# Patient Record
Sex: Female | Born: 1935 | Race: White | Hispanic: No | Smoking: Never smoker
Health system: Southern US, Community
[De-identification: ages and names within clinical notes are randomized; demographics above are authoritative.]

## PROBLEM LIST (undated history)

## (undated) DIAGNOSIS — F0390 Unspecified dementia without behavioral disturbance: Secondary | ICD-10-CM

## (undated) DIAGNOSIS — I10 Essential (primary) hypertension: Secondary | ICD-10-CM

## (undated) DIAGNOSIS — Z975 Presence of (intrauterine) contraceptive device: Secondary | ICD-10-CM

## (undated) DIAGNOSIS — E785 Hyperlipidemia, unspecified: Secondary | ICD-10-CM

## (undated) HISTORY — PX: OTHER SURGICAL HISTORY: SHX169

## (undated) HISTORY — DX: Hyperlipidemia, unspecified: E78.5

---

## 1997-07-02 ENCOUNTER — Ambulatory Visit (HOSPITAL_COMMUNITY): Admission: RE | Admit: 1997-07-02 | Discharge: 1997-07-02 | Payer: Self-pay | Admitting: Internal Medicine

## 1999-05-26 ENCOUNTER — Encounter: Admission: RE | Admit: 1999-05-26 | Discharge: 1999-05-26 | Payer: Self-pay | Admitting: Internal Medicine

## 1999-05-26 ENCOUNTER — Encounter: Payer: Self-pay | Admitting: Internal Medicine

## 2000-09-14 ENCOUNTER — Encounter: Admission: RE | Admit: 2000-09-14 | Discharge: 2000-09-14 | Payer: Self-pay | Admitting: Internal Medicine

## 2000-09-14 ENCOUNTER — Encounter: Payer: Self-pay | Admitting: Internal Medicine

## 2002-03-22 ENCOUNTER — Encounter: Payer: Self-pay | Admitting: Emergency Medicine

## 2002-03-22 ENCOUNTER — Emergency Department (HOSPITAL_COMMUNITY): Admission: EM | Admit: 2002-03-22 | Discharge: 2002-03-22 | Payer: Self-pay | Admitting: Emergency Medicine

## 2003-03-26 ENCOUNTER — Ambulatory Visit (HOSPITAL_COMMUNITY): Admission: RE | Admit: 2003-03-26 | Discharge: 2003-03-26 | Payer: Self-pay | Admitting: Obstetrics and Gynecology

## 2003-04-18 ENCOUNTER — Ambulatory Visit (HOSPITAL_COMMUNITY): Admission: RE | Admit: 2003-04-18 | Discharge: 2003-04-18 | Payer: Self-pay | Admitting: Obstetrics and Gynecology

## 2003-04-18 ENCOUNTER — Encounter (INDEPENDENT_AMBULATORY_CARE_PROVIDER_SITE_OTHER): Payer: Self-pay | Admitting: *Deleted

## 2003-05-28 ENCOUNTER — Encounter: Admission: RE | Admit: 2003-05-28 | Discharge: 2003-05-28 | Payer: Self-pay | Admitting: Internal Medicine

## 2003-08-20 ENCOUNTER — Encounter: Admission: RE | Admit: 2003-08-20 | Discharge: 2003-08-20 | Payer: Self-pay | Admitting: Internal Medicine

## 2003-08-27 ENCOUNTER — Encounter: Admission: RE | Admit: 2003-08-27 | Discharge: 2003-08-27 | Payer: Self-pay | Admitting: Internal Medicine

## 2003-09-03 ENCOUNTER — Encounter (HOSPITAL_COMMUNITY): Admission: RE | Admit: 2003-09-03 | Discharge: 2003-09-23 | Payer: Self-pay | Admitting: Internal Medicine

## 2007-07-08 ENCOUNTER — Emergency Department (HOSPITAL_COMMUNITY): Admission: EM | Admit: 2007-07-08 | Discharge: 2007-07-08 | Payer: Self-pay | Admitting: Emergency Medicine

## 2010-06-04 NOTE — Op Note (Signed)
Diane Nolan, Diane Nolan                           ACCOUNT NO.:  [id number]   MEDICAL RECORD NO.:  [id number]                   PATIENT TYPE:  AMB   LOCATION:  DAY                                  FACILITY:  [facility]   PHYSICIAN:  Malachi Pro. Ambrose Mantle, M.D.              DATE OF BIRTH:  [birth year]   DATE OF PROCEDURE:  04/18/2003  DATE OF DISCHARGE:                                 OPERATIVE REPORT   PREOPERATIVE DIAGNOSES:  1. Fourth-degree cystocele.  2. Uterine prolapse.  3. History of postmenopausal bleeding.  4. Cervical stenosis.   POSTOPERATIVE DIAGNOSES:  1. Fourth-degree cystocele.  2. Uterine prolapse.  3. History of postmenopausal bleeding.  4. Cervical stenosis.  5. Endometrial polyp.   OPERATION:  1. Fractional D&C.  2. Hysteroscopy.  3. Removal of endometrial polyp.   OPERATOR:  Malachi Pro. Ambrose Mantle, M.D.   ANESTHESIA:  General.   DESCRIPTION OF PROCEDURE:  The patient was brought to the operating room and  placed under satisfactory general anesthesia and placed in lithotomy  position.  Exam revealed the bladder to be prolapsed through the vaginal  introitus, and cervix presented at the vaginal introitus.  There was minimal  rectocele.  The uterus was so poorly supported that it was difficult to feel  but was thought to be mid plane and normal size.  The adnexa were free of  masses.  The vulva, vagina, and perineum were prepped with Betadine  solution, and the patient was draped so that he drape would collect the  sorbitol.  The cervix was drawn into the operative field from the introitus  and since I could not sound the uterus in the office, I was very careful to  use the smallest dilator and try not to force my way into the endometrial  cavity.  I was able to discern a cavity and dilated the cervix to a #25  Pratt dilator.  I then introduced the hysteroscope and easily was able to  identify an endometrial polyp.  The biopsy forceps with the hysteroscope was  not the one  that I prefer.  It was too flimsy to get a good purchase on the  material, so I abandoned that, removed the hysteroscope and removed the  polyp with the polyp forceps.  I then re-looked with the hysteroscope and  saw that I had removed the polyp forceps.  There was also a submucosal bump  in the endometrial cavity that I did not remove because it was not in the  endometrial lining.  It could have represented a small submucous fibroid.  I  could see the right tubal ostia clearly.  The left tubal ostia was never  clearly seen.  There were no other polyps or intraluminal masses in the  endometrial cavity.  The endocervical canal was clean.  I finished by doing  an endocervical and then endometrial curettage.  There was a bleeding point  from the tenaculum site on the  cervix that I sutured to stop the bleeding, and the procedure was  terminated.  The deficit was 140 mL of sorbitol, but there was significant  sorbitol on the floor secondary to improper fitting of the hysteroscope.  The patient was returned to recovery in satisfactory condition.                                               Malachi Pro. Ambrose Mantle, M.D.    TFH/MEDQ  D:  04/18/2003  T:  04/18/2003  Job:  [id number]

## 2010-10-14 LAB — POCT I-STAT, CHEM 8
BUN: 25 — ABNORMAL HIGH
Calcium, Ion: 1.16
Chloride: 104
Creatinine, Ser: 1.1
Glucose, Bld: 134 — ABNORMAL HIGH
HCT: 42
Hemoglobin: 14.3
Potassium: 4.1
Sodium: 137
TCO2: 27

## 2010-10-14 LAB — CBC
HCT: 39.8
Hemoglobin: 13.2
MCHC: 33.1
MCV: 92.9
Platelets: 211
RBC: 4.29
RDW: 14.9
WBC: 11.2 — ABNORMAL HIGH

## 2010-10-14 LAB — POCT CARDIAC MARKERS
CKMB, poc: <1 — ABNORMAL LOW
Myoglobin, poc: 70.4
Operator id: 196461
Troponin i, poc: <0.05

## 2010-10-14 LAB — DIFFERENTIAL
Basophils Absolute: 0
Basophils Relative: 0
Eosinophils Absolute: 0.3
Eosinophils Relative: 2
Lymphocytes Relative: 20
Lymphs Abs: 2.2
Monocytes Absolute: 0.3
Monocytes Relative: 3
Neutro Abs: 8.4 — ABNORMAL HIGH
Neutrophils Relative %: 75

## 2011-03-22 ENCOUNTER — Other Ambulatory Visit: Payer: Self-pay | Admitting: Internal Medicine

## 2011-03-25 ENCOUNTER — Other Ambulatory Visit: Payer: Self-pay

## 2011-03-25 ENCOUNTER — Ambulatory Visit
Admission: RE | Admit: 2011-03-25 | Discharge: 2011-03-25 | Disposition: A | Discharge status: Home / Self Care | Payer: Medicare Other | Source: Ambulatory Visit | Attending: Internal Medicine | Admitting: Internal Medicine

## 2011-04-29 ENCOUNTER — Emergency Department (HOSPITAL_COMMUNITY): Payer: Medicare Other

## 2011-04-29 ENCOUNTER — Encounter (HOSPITAL_COMMUNITY): Payer: Self-pay | Admitting: General Practice

## 2011-04-29 ENCOUNTER — Emergency Department (HOSPITAL_COMMUNITY)
Admission: EM | Admit: 2011-04-29 | Discharge: 2011-04-29 | Disposition: A | Discharge status: Home / Self Care | Payer: Medicare Other | Attending: Emergency Medicine | Admitting: Emergency Medicine

## 2011-04-29 DIAGNOSIS — Z79899 Other long term (current) drug therapy: Secondary | ICD-10-CM | POA: Insufficient documentation

## 2011-04-29 DIAGNOSIS — R1011 Right upper quadrant pain: Secondary | ICD-10-CM | POA: Insufficient documentation

## 2011-04-29 DIAGNOSIS — K8050 Calculus of bile duct without cholangitis or cholecystitis without obstruction: Secondary | ICD-10-CM

## 2011-04-29 DIAGNOSIS — K8020 Calculus of gallbladder without cholecystitis without obstruction: Secondary | ICD-10-CM | POA: Insufficient documentation

## 2011-04-29 DIAGNOSIS — N390 Urinary tract infection, site not specified: Secondary | ICD-10-CM | POA: Insufficient documentation

## 2011-04-29 DIAGNOSIS — R109 Unspecified abdominal pain: Secondary | ICD-10-CM | POA: Insufficient documentation

## 2011-04-29 DIAGNOSIS — E119 Type 2 diabetes mellitus without complications: Secondary | ICD-10-CM | POA: Insufficient documentation

## 2011-04-29 LAB — URINALYSIS, ROUTINE W REFLEX MICROSCOPIC
Bilirubin Urine: NEGATIVE
Glucose, UA: NEGATIVE mg/dL
Ketones, ur: NEGATIVE mg/dL
Nitrite: POSITIVE — AB
Protein, ur: NEGATIVE mg/dL
Specific Gravity, Urine: 1.01 (ref 1.005–1.030)
Urobilinogen, UA: 0.2 mg/dL (ref 0.0–1.0)
pH: 6.5 (ref 5.0–8.0)

## 2011-04-29 LAB — COMPREHENSIVE METABOLIC PANEL
ALT: 6 U/L (ref 0–35)
AST: 15 U/L (ref 0–37)
Albumin: 3.6 g/dL (ref 3.5–5.2)
Alkaline Phosphatase: 53 U/L (ref 39–117)
BUN: 22 mg/dL (ref 6–23)
CO2: 25 mEq/L (ref 19–32)
Calcium: 9.4 mg/dL (ref 8.4–10.5)
Chloride: 103 mEq/L (ref 96–112)
Creatinine, Ser: 1.09 mg/dL (ref 0.50–1.10)
GFR calc Af Amer: 54 mL/min — ABNORMAL LOW (ref >90)
GFR calc non Af Amer: 46 mL/min — ABNORMAL LOW (ref >90)
Glucose, Bld: 108 mg/dL — ABNORMAL HIGH (ref 70–99)
Potassium: 4.3 mEq/L (ref 3.5–5.1)
Sodium: 138 mEq/L (ref 135–145)
Total Bilirubin: 0.6 mg/dL (ref 0.3–1.2)
Total Protein: 6.9 g/dL (ref 6.0–8.3)

## 2011-04-29 LAB — CBC
HCT: 36 % (ref 36.0–46.0)
Hemoglobin: 11.8 g/dL — ABNORMAL LOW (ref 12.0–15.0)
MCH: 30.1 pg (ref 26.0–34.0)
MCHC: 32.8 g/dL (ref 30.0–36.0)
MCV: 91.8 fL (ref 78.0–100.0)
Platelets: 234 10*3/uL (ref 150–400)
RBC: 3.92 MIL/uL (ref 3.87–5.11)
RDW: 14.2 % (ref 11.5–15.5)
WBC: 6.7 10*3/uL (ref 4.0–10.5)

## 2011-04-29 LAB — URINE MICROSCOPIC-ADD ON

## 2011-04-29 LAB — LIPASE, BLOOD: Lipase: 32 U/L (ref 11–59)

## 2011-04-29 MED ORDER — ONDANSETRON HCL 4 MG/2ML IJ SOLN
4.0000 mg | Freq: Once | INTRAMUSCULAR | Status: DC
  Filled 2011-04-29: qty 2

## 2011-04-29 MED ORDER — SODIUM CHLORIDE 0.9 % IV BOLUS (SEPSIS)
500.0000 mL | Freq: Once | INTRAVENOUS | Status: AC
  Administered 2011-04-29: 500 mL via INTRAVENOUS

## 2011-04-29 MED ORDER — DEXTROSE 5 % IV SOLN: 1.0000 g | Freq: Once | INTRAVENOUS | Status: DC

## 2011-04-29 MED ORDER — MORPHINE SULFATE 4 MG/ML IJ SOLN
4.0000 mg | Freq: Once | INTRAMUSCULAR | Status: DC
  Filled 2011-04-29: qty 1

## 2011-04-29 MED ORDER — CEPHALEXIN 250 MG PO CAPS
250.0000 mg | ORAL_CAPSULE | Freq: Once | ORAL | Status: AC
  Administered 2011-04-29: 250 mg via ORAL
  Filled 2011-04-29: qty 1

## 2011-04-29 MED ORDER — CEPHALEXIN 500 MG PO CAPS: 500.0000 mg | ORAL_CAPSULE | Freq: Four times a day (QID) | ORAL | Status: AC

## 2011-04-29 NOTE — ED Notes (Signed)
Report received from [place], [place].  U/S tech at bedside at this time.

## 2011-04-29 NOTE — ED Provider Notes (Signed)
History     CSN: [id number]  Arrival date & time 04/29/11  1036   First MD Initiated Contact with Patient 04/29/11 1057      Chief Complaint  Patient presents with  . Abdominal Pain    (Consider location/radiation/quality/duration/timing/severity/associated sxs/prior treatment) HPI Pt presents with c/o right upper abdominal pain.  Pain has been intermittent over the past several days.  Pain is dull, intermittent.  No fever/chills, no n/v/d.  Approx one month ago patient had ultrasound and was diagnosed with gallstones- she saw her PMD earlier this week who prescribed pain medication which does help with pain but due to continued pain her PMD advised her to come to the ED.  She states she has called for appointment for follow up with surgery but has not been able to see them yet.  There are no other associated systemic symptoms. There are no alleviating or modifying factors.  Symptoms described as moderate.   Past Medical History  Diagnosis Date  . Diabetes mellitus     History reviewed. No pertinent past surgical history.  No family history on file.  History  Substance Use Topics  . Smoking status: Never Smoker   . Smokeless tobacco: Not on file  . Alcohol Use: No    OB History    Grav Para Term Preterm Abortions TAB SAB Ect Mult Living                  Review of Systems ROS reviewed and all otherwise negative except for mentioned in HPI  Allergies  Review of patient's allergies indicates no known allergies.  Home Medications   Current Outpatient Rx  Name Route Sig Dispense Refill  . ATENOLOL 25 MG PO TABS Oral Take 25 mg by mouth daily.    Marland Kitchen LEVOTHYROXINE SODIUM 50 MCG PO TABS Oral Take 50 mcg by mouth daily.    Marland Kitchen METFORMIN HCL 500 MG PO TABS Oral Take 500 mg by mouth 2 (two) times daily with a meal.    . ADULT MULTIVITAMIN W/MINERALS CH Oral Take 1 tablet by mouth daily.    Marland Kitchen SIMVASTATIN 20 MG PO TABS Oral Take 20 mg by mouth every evening.    . CEPHALEXIN 500  MG PO CAPS Oral Take 1 capsule (500 mg total) by mouth 4 (four) times daily. 28 capsule 0  . HYDROCODONE-ACETAMINOPHEN 5-500 MG PO TABS Oral Take 1 tablet by mouth every 6 (six) hours as needed. For pain.      BP 149/77  Pulse 68  Temp(Src) 98.1 F (36.7 C) (Oral)  Resp 14  SpO2 98% Vitals reviewed Physical Exam Physical Examination: General appearance - alert, well appearing, and in no distress Mental status - alert, oriented to person, place, and time Eyes - pupils equal and reactive, no scleral icterus Mouth - mucous membranes moist, pharynx normal without lesions Chest - clear to auscultation, no wheezes, rales or rhonchi, symmetric air entry Heart - normal rate, regular rhythm, normal S1, S2, no murmurs, rubs, clicks or gallops Abdomen - soft, nontender, nondistended, no masses or organomegaly, nabs Back exam - full range of motion, no tenderness, no CVA tenderness Extremities - peripheral pulses normal, no pedal edema, no clubbing or cyanosis Skin - normal coloration and turgor, no rashes  ED Course  Procedures (including critical care time)  Labs Reviewed  URINALYSIS, ROUTINE W REFLEX MICROSCOPIC - Abnormal; Notable for the following:    APPearance CLOUDY (*)    Hgb urine dipstick TRACE (*)  Nitrite POSITIVE (*)    Leukocytes, UA MODERATE (*)    All other components within normal limits  CBC - Abnormal; Notable for the following:    Hemoglobin 11.8 (*)    All other components within normal limits  COMPREHENSIVE METABOLIC PANEL - Abnormal; Notable for the following:    Glucose, Bld 108 (*)    GFR calc non Af Amer 46 (*)    GFR calc Af Amer 54 (*)    All other components within normal limits  URINE MICROSCOPIC-ADD ON - Abnormal; Notable for the following:    Bacteria, UA MANY (*)    All other components within normal limits  LIPASE, BLOOD   US Abdomen Complete  04/29/2011  *RADIOLOGY REPORT*  Clinical Data:  Abdominal pain, gallstones.  COMPLETE ABDOMINAL  ULTRASOUND  Comparison:  03/25/2011  Findings:  Gallbladder:  Multiple gallstones again noted within the gallbladder.  Largest measures 1.7 cm.  No wall thickening. Negative sonographic Murphy's.  No real change since prior study.  Common bile duct:   Normal caliber, 4 mm.  Liver:  No focal lesion identified.  Within normal limits in parenchymal echogenicity.  IVC:  Appears normal.  Pancreas:  No focal abnormality seen.  Spleen:  Within normal limits in size and echotexture.  Right Kidney:   2.3 cm cyst in the upper pole of the right kidney. No hydronephrosis.  Normal echotexture.  Left Kidney:  Normal in size and parenchymal echogenicity.  No evidence of mass or hydronephrosis.  Abdominal aorta:  No aneurysm identified.  IMPRESSION: Gallstones fill the gallbladder.  No wall thickening or sonographic Murphy's sign to suggest acute cholecystitis.  Original Report Authenticated By: Cyndie Chime, M.D.     1. Gallstones   2. Biliary colic   3. Urinary tract infection       MDM  Pt with hx of gallstones presents with intermittent RUQ abdominal pain.  Workup in ED today reveals no findings of cholecystitis or biliary obstruction.  She does have findings of a UTI- long discussion with patient and her son about importance of arranging for follow up with surgery to discuss possible cholecystectomy.  She was started on keflex for UTI.  Her pain is under good control, and she states the hydrocodone prescribed by her PMD does help with her pain.  Discharged with strict return precautions.  Pt agreeable with plan.   Prior records reviewed and considered during this visit- pt had abdominal ultrasound 03/25/11 showing gallstones but no cholecystitis.         Ethelda Chick, MD 05/02/11 1550

## 2011-04-29 NOTE — Discharge Instructions (Signed)
Return to the ED with any concerns including fever/chills, vomiting and not able to keep down antibiotics or liquids, worsening pain not controlled by pain medications, decreased level of alertness/lethargy, or any other alarming symptoms

## 2011-04-29 NOTE — ED Notes (Signed)
Rt side abd pain states that she called her md and told her she has gallstones and that she needs to come to the er. No n/v just pain, denies any urinary problem.

## 2011-05-06 ENCOUNTER — Encounter (INDEPENDENT_AMBULATORY_CARE_PROVIDER_SITE_OTHER): Payer: Self-pay | Admitting: Surgery

## 2011-05-06 ENCOUNTER — Ambulatory Visit (INDEPENDENT_AMBULATORY_CARE_PROVIDER_SITE_OTHER): Payer: Medicare Other | Admitting: Surgery

## 2011-05-06 VITALS — BP 142/70 | HR 75 | Temp 97.0°F | Resp 16 | Ht 59.0 in | Wt 157.2 lb

## 2011-05-06 DIAGNOSIS — K8020 Calculus of gallbladder without cholecystitis without obstruction: Secondary | ICD-10-CM

## 2011-05-06 NOTE — Patient Instructions (Signed)

## 2011-05-06 NOTE — Progress Notes (Signed)
Patient ID: Diane Nolan, female   DOB: 05/30/1928, 78 y.o.   MRN: [id number]  Chief Complaint  Patient presents with  . New Evaluation    eval GB    HPI Diane Nolan is a 78 y.o. female.          HPI  Patient presents at the request of Dr. Roberts due to right upper quadrant pain. She has had it for one month. It waxes and wanes. She was seen in the emergency room one week ago for a severe exacerbation of right upper quadrant pain. It is sharp in nature. The symptoms wax and wane. Nothing seems to make it worse or better. Ultrasound was obtained which shows gallstones. Liver function studies and CBC were normal. The pain is now gone. Past Medical History  Diagnosis Date  . Diabetes mellitus     Past Surgical History  Procedure Date  . Child birth X1    No family history on file.  Social History History  Substance Use Topics  . Smoking status: Never Smoker   . Smokeless tobacco: Not on file  . Alcohol Use: No    No Known Allergies  Current Outpatient Prescriptions  Medication Sig Dispense Refill  . atenolol (TENORMIN) 25 MG tablet Take 25 mg by mouth daily.      . cephALEXin (KEFLEX) 500 MG capsule Take 1 capsule (500 mg total) by mouth 4 (four) times daily.  28 capsule  0  . HYDROcodone-acetaminophen (VICODIN) 5-500 MG per tablet Take 1 tablet by mouth every 6 (six) hours as needed. For pain.      . levothyroxine (SYNTHROID, LEVOTHROID) 50 MCG tablet Take 50 mcg by mouth daily.      . metFORMIN (GLUCOPHAGE) 500 MG tablet Take 500 mg by mouth 2 (two) times daily with a meal.      . Multiple Vitamin (MULITIVITAMIN WITH MINERALS) TABS Take 1 tablet by mouth daily.      . simvastatin (ZOCOR) 20 MG tablet Take 20 mg by mouth every evening.        Review of Systems Review of Systems  Constitutional: Negative for fever, chills and unexpected weight change.  HENT: Negative for hearing loss, congestion, sore throat, trouble swallowing and voice change.   Eyes: Negative for  visual disturbance.  Respiratory: Negative for cough and wheezing.   Cardiovascular: Negative for chest pain, palpitations and leg swelling.  Gastrointestinal: Negative for nausea, vomiting, abdominal pain, diarrhea, constipation, blood in stool, abdominal distention and anal bleeding.  Genitourinary: Negative for hematuria, vaginal bleeding and difficulty urinating.  Musculoskeletal: Negative for arthralgias.  Skin: Negative for rash and wound.  Neurological: Negative for seizures, syncope and headaches.  Hematological: Negative for adenopathy. Does not bruise/bleed easily.  Psychiatric/Behavioral: Negative for confusion.    Blood pressure 142/70, pulse 75, temperature 97 F (36.1 C), temperature source Temporal, resp. rate 16, height 4' 11" (1.499 m), weight 157 lb 3.2 oz (71.305 kg).  Physical Exam Physical Exam  Constitutional: She is oriented to person, place, and time. She appears well-developed and well-nourished.  HENT:  Head: Normocephalic and atraumatic.  Eyes: EOM are normal. Pupils are equal, round, and reactive to light.  Neck: Normal range of motion. Neck supple.  Cardiovascular: Normal rate and regular rhythm.   Pulmonary/Chest: Effort normal and breath sounds normal.  Abdominal: Soft. Bowel sounds are normal. There is no tenderness.  Musculoskeletal: Normal range of motion.  Neurological: She is alert and oriented to person, place, and time.  Skin: Skin is   warm and dry.  Psychiatric: She has a normal mood and affect. Her behavior is normal. Judgment and thought content normal.    Data Reviewed Gallstones normal lft's   Assessment    Symptomatic cholelithiasis.    Plan LAPAROSCOPIC CHOLECYSTECTOMY    The procedure has been discussed with the patient. Operative and non operative treatments have been discussed. Risks of surgery include bleeding, infection,  Common bile duct injury,  Injury to the stomach,liver, colon,small intestine, abdominal wall,  Diaphragm,   Major blood vessels,  And the need for an open procedure.  Other risks include worsening of medical problems, death,  DVT and pulmonary embolism, and cardiovascular events.   Medical options have also been discussed. The patient has been informed of long term expectations of surgery and non surgical options,  The patient agrees to proceed.         Artemis Koller A. 05/06/2011, 11:19 AM    

## 2011-05-25 ENCOUNTER — Inpatient Hospital Stay (HOSPITAL_COMMUNITY): Admission: RE | Admit: 2011-05-25 | Discharge: 2011-05-25 | Payer: Medicare Other | Source: Ambulatory Visit

## 2011-05-27 ENCOUNTER — Encounter (HOSPITAL_BASED_OUTPATIENT_CLINIC_OR_DEPARTMENT_OTHER): Payer: Self-pay | Admitting: *Deleted

## 2011-05-27 NOTE — Progress Notes (Signed)
Pt sounds good on phone-[place]? Son to come dos-she drives-will come in for labs,cxr,ekg She has very little health-surgical hx. and -has a pessary in Did review with Sandy,Jenny,and dr kasik-expressed concerns to Dr Mignon Pine about age and health of pt for lap choli here at [facility].

## 2011-05-27 NOTE — Progress Notes (Signed)
Dr Mignon Pine called back-he feels pt healthy enough for dsc and to go home same day surgery Dr Gypsy Balsam ok with that and sandy's decision to do here

## 2011-05-31 ENCOUNTER — Ambulatory Visit
Admission: RE | Admit: 2011-05-31 | Discharge: 2011-05-31 | Disposition: A | Discharge status: Home / Self Care | Payer: Medicare Other | Source: Ambulatory Visit | Attending: Surgery | Admitting: Surgery

## 2011-05-31 ENCOUNTER — Encounter (HOSPITAL_BASED_OUTPATIENT_CLINIC_OR_DEPARTMENT_OTHER)
Admission: RE | Admit: 2011-05-31 | Discharge: 2011-05-31 | Disposition: A | Discharge status: Home / Self Care | Payer: Medicare Other | Source: Ambulatory Visit | Attending: Surgery | Admitting: Surgery

## 2011-05-31 ENCOUNTER — Other Ambulatory Visit: Payer: Self-pay

## 2011-05-31 LAB — CBC
HCT: 38.7 % (ref 36.0–46.0)
Hemoglobin: 13 g/dL (ref 12.0–15.0)
MCH: 29.9 pg (ref 26.0–34.0)
MCHC: 33.6 g/dL (ref 30.0–36.0)
MCV: 89 fL (ref 78.0–100.0)
Platelets: 263 10*3/uL (ref 150–400)
RBC: 4.35 MIL/uL (ref 3.87–5.11)
RDW: 14.2 % (ref 11.5–15.5)
WBC: 8 10*3/uL (ref 4.0–10.5)

## 2011-05-31 LAB — COMPREHENSIVE METABOLIC PANEL
ALT: 8 U/L (ref 0–35)
AST: 14 U/L (ref 0–37)
Albumin: 3.7 g/dL (ref 3.5–5.2)
Alkaline Phosphatase: 62 U/L (ref 39–117)
BUN: 28 mg/dL — ABNORMAL HIGH (ref 6–23)
CO2: 23 mEq/L (ref 19–32)
Calcium: 9.7 mg/dL (ref 8.4–10.5)
Chloride: 102 mEq/L (ref 96–112)
Creatinine, Ser: 1.04 mg/dL (ref 0.50–1.10)
GFR calc Af Amer: 57 mL/min — ABNORMAL LOW (ref >90)
GFR calc non Af Amer: 49 mL/min — ABNORMAL LOW (ref >90)
Glucose, Bld: 106 mg/dL — ABNORMAL HIGH (ref 70–99)
Potassium: 5.1 mEq/L (ref 3.5–5.1)
Sodium: 137 mEq/L (ref 135–145)
Total Bilirubin: 0.6 mg/dL (ref 0.3–1.2)
Total Protein: 7.2 g/dL (ref 6.0–8.3)

## 2011-05-31 LAB — DIFFERENTIAL
Basophils Absolute: 0 10*3/uL (ref 0.0–0.1)
Basophils Relative: 0 % (ref 0–1)
Eosinophils Absolute: 0.4 10*3/uL (ref 0.0–0.7)
Eosinophils Relative: 5 % (ref 0–5)
Lymphocytes Relative: 42 % (ref 12–46)
Lymphs Abs: 3.4 10*3/uL (ref 0.7–4.0)
Monocytes Absolute: 0.5 10*3/uL (ref 0.1–1.0)
Monocytes Relative: 6 % (ref 3–12)
Neutro Abs: 3.8 10*3/uL (ref 1.7–7.7)
Neutrophils Relative %: 47 % (ref 43–77)

## 2011-06-01 ENCOUNTER — Encounter (HOSPITAL_BASED_OUTPATIENT_CLINIC_OR_DEPARTMENT_OTHER)
Admission: RE | Disposition: A | Discharge status: Home / Self Care | Payer: Self-pay | Source: Ambulatory Visit | Attending: Surgery

## 2011-06-01 ENCOUNTER — Ambulatory Visit (HOSPITAL_COMMUNITY): Payer: Medicare Other

## 2011-06-01 ENCOUNTER — Encounter (HOSPITAL_BASED_OUTPATIENT_CLINIC_OR_DEPARTMENT_OTHER): Payer: Self-pay | Admitting: Certified Registered Nurse Anesthetist

## 2011-06-01 ENCOUNTER — Encounter (HOSPITAL_COMMUNITY): Admission: RE | Payer: Self-pay | Source: Ambulatory Visit

## 2011-06-01 ENCOUNTER — Ambulatory Visit (HOSPITAL_BASED_OUTPATIENT_CLINIC_OR_DEPARTMENT_OTHER): Payer: Medicare Other | Admitting: Certified Registered Nurse Anesthetist

## 2011-06-01 ENCOUNTER — Encounter (HOSPITAL_BASED_OUTPATIENT_CLINIC_OR_DEPARTMENT_OTHER): Payer: Self-pay | Admitting: *Deleted

## 2011-06-01 ENCOUNTER — Ambulatory Visit (HOSPITAL_COMMUNITY): Admission: RE | Admit: 2011-06-01 | Payer: Medicare Other | Source: Ambulatory Visit | Admitting: Surgery

## 2011-06-01 ENCOUNTER — Ambulatory Visit (HOSPITAL_BASED_OUTPATIENT_CLINIC_OR_DEPARTMENT_OTHER)
Admission: RE | Admit: 2011-06-01 | Discharge: 2011-06-01 | Disposition: A | Discharge status: Home / Self Care | Payer: Medicare Other | Source: Ambulatory Visit | Attending: Surgery | Admitting: Surgery

## 2011-06-01 DIAGNOSIS — I10 Essential (primary) hypertension: Secondary | ICD-10-CM | POA: Insufficient documentation

## 2011-06-01 DIAGNOSIS — K8010 Calculus of gallbladder with chronic cholecystitis without obstruction: Secondary | ICD-10-CM

## 2011-06-01 DIAGNOSIS — Z01812 Encounter for preprocedural laboratory examination: Secondary | ICD-10-CM | POA: Insufficient documentation

## 2011-06-01 DIAGNOSIS — Z01810 Encounter for preprocedural cardiovascular examination: Secondary | ICD-10-CM | POA: Insufficient documentation

## 2011-06-01 DIAGNOSIS — K810 Acute cholecystitis: Secondary | ICD-10-CM | POA: Insufficient documentation

## 2011-06-01 DIAGNOSIS — Z9889 Other specified postprocedural states: Secondary | ICD-10-CM

## 2011-06-01 DIAGNOSIS — E119 Type 2 diabetes mellitus without complications: Secondary | ICD-10-CM | POA: Insufficient documentation

## 2011-06-01 HISTORY — PX: CHOLECYSTECTOMY: SHX55

## 2011-06-01 HISTORY — DX: Presence of (intrauterine) contraceptive device: Z97.5

## 2011-06-01 HISTORY — DX: Essential (primary) hypertension: I10

## 2011-06-01 LAB — GLUCOSE, CAPILLARY
Glucose-Capillary: 101 mg/dL — ABNORMAL HIGH (ref 70–99)
Glucose-Capillary: 182 mg/dL — ABNORMAL HIGH (ref 70–99)

## 2011-06-01 SURGERY — LAPAROSCOPIC CHOLECYSTECTOMY WITH INTRAOPERATIVE CHOLANGIOGRAM
Anesthesia: General | Site: Abdomen | Wound class: Clean

## 2011-06-01 SURGERY — LAPAROSCOPIC CHOLECYSTECTOMY WITH INTRAOPERATIVE CHOLANGIOGRAM
Anesthesia: General

## 2011-06-01 MED ORDER — CEFAZOLIN SODIUM-DEXTROSE 2-3 GM-% IV SOLR: 2.0000 g | INTRAVENOUS | Status: DC

## 2011-06-01 MED ORDER — ROCURONIUM BROMIDE 100 MG/10ML IV SOLN
INTRAVENOUS | Status: DC | PRN
  Administered 2011-06-01: 30 mg via INTRAVENOUS
  Administered 2011-06-01: 5 mg via INTRAVENOUS

## 2011-06-01 MED ORDER — HYDROMORPHONE HCL PF 1 MG/ML IJ SOLN
0.2500 mg | INTRAMUSCULAR | Status: DC | PRN
  Administered 2011-06-01 (×4): 0.5 mg via INTRAVENOUS

## 2011-06-01 MED ORDER — EPHEDRINE SULFATE 50 MG/ML IJ SOLN
INTRAMUSCULAR | Status: DC | PRN
  Administered 2011-06-01: 10 mg via INTRAVENOUS

## 2011-06-01 MED ORDER — LACTATED RINGERS IV SOLN
INTRAVENOUS | Status: DC | PRN
  Administered 2011-06-01 (×2): via INTRAVENOUS

## 2011-06-01 MED ORDER — FENTANYL CITRATE 0.05 MG/ML IJ SOLN
INTRAMUSCULAR | Status: DC | PRN
  Administered 2011-06-01: 50 ug via INTRAVENOUS

## 2011-06-01 MED ORDER — SODIUM CHLORIDE 0.9 % IR SOLN
Status: DC | PRN
  Administered 2011-06-01: 1

## 2011-06-01 MED ORDER — SODIUM CHLORIDE 0.9 % IV SOLN
INTRAVENOUS | Status: DC | PRN
  Administered 2011-06-01: 12:00:00

## 2011-06-01 MED ORDER — ACETAMINOPHEN 10 MG/ML IV SOLN
INTRAVENOUS | Status: DC | PRN
  Administered 2011-06-01: 1000 mg via INTRAVENOUS

## 2011-06-01 MED ORDER — BUPIVACAINE-EPINEPHRINE 0.25% -1:200000 IJ SOLN
INTRAMUSCULAR | Status: DC | PRN
  Administered 2011-06-01: 10 mL

## 2011-06-01 MED ORDER — LACTATED RINGERS IV SOLN
INTRAVENOUS | Status: DC
  Administered 2011-06-01: 15:00:00 via INTRAVENOUS

## 2011-06-01 MED ORDER — PROPOFOL 10 MG/ML IV EMUL
INTRAVENOUS | Status: DC | PRN
  Administered 2011-06-01: 150 mg via INTRAVENOUS

## 2011-06-01 MED ORDER — CHLORHEXIDINE GLUCONATE 4 % EX LIQD: 1.0000 "application " | Freq: Once | CUTANEOUS | Status: DC

## 2011-06-01 MED ORDER — ONDANSETRON HCL 4 MG/2ML IJ SOLN
INTRAMUSCULAR | Status: DC | PRN
  Administered 2011-06-01: 4 mg via INTRAVENOUS

## 2011-06-01 MED ORDER — CEFAZOLIN SODIUM 1-5 GM-% IV SOLN
INTRAVENOUS | Status: DC | PRN
  Administered 2011-06-01: 2 g via INTRAVENOUS

## 2011-06-01 MED ORDER — ONDANSETRON HCL 4 MG/2ML IJ SOLN
4.0000 mg | Freq: Once | INTRAMUSCULAR | Status: AC | PRN
  Administered 2011-06-01: 4 mg via INTRAVENOUS

## 2011-06-01 MED ORDER — DEXAMETHASONE SODIUM PHOSPHATE 4 MG/ML IJ SOLN
INTRAMUSCULAR | Status: DC | PRN
  Administered 2011-06-01: 10 mg via INTRAVENOUS

## 2011-06-01 MED ORDER — LIDOCAINE HCL (CARDIAC) 20 MG/ML IV SOLN
INTRAVENOUS | Status: DC | PRN
  Administered 2011-06-01 (×2): 50 mg via INTRAVENOUS

## 2011-06-01 MED ORDER — METOCLOPRAMIDE HCL 5 MG/ML IJ SOLN
INTRAMUSCULAR | Status: DC | PRN
  Administered 2011-06-01: 10 mg via INTRAVENOUS

## 2011-06-01 MED ORDER — HYDROCODONE-ACETAMINOPHEN 5-325 MG PO TABS: 1.0000 | ORAL_TABLET | Freq: Four times a day (QID) | ORAL | Status: AC | PRN

## 2011-06-01 MED ORDER — GLYCOPYRROLATE 0.2 MG/ML IJ SOLN
INTRAMUSCULAR | Status: DC | PRN
  Administered 2011-06-01: .5 mg via INTRAVENOUS

## 2011-06-01 MED ORDER — NEOSTIGMINE METHYLSULFATE 1 MG/ML IJ SOLN
INTRAMUSCULAR | Status: DC | PRN
  Administered 2011-06-01: 3 mg via INTRAVENOUS

## 2011-06-01 SURGICAL SUPPLY — 39 items
APPLIER CLIP 5 13 M/L LIGAMAX5 (MISCELLANEOUS)
APPLIER CLIP ROT 10 11.4 M/L (STAPLE) ×2
BLADE SURG ROTATE 9660 (MISCELLANEOUS) IMPLANT
CANISTER LINER 1300 C W/ELBOW (MISCELLANEOUS) ×2 IMPLANT
CANISTER SUCTION 2500CC (MISCELLANEOUS) ×2 IMPLANT
CHLORAPREP W/TINT 26ML (MISCELLANEOUS) ×2 IMPLANT
CLIP APPLIE 5 13 M/L LIGAMAX5 (MISCELLANEOUS) IMPLANT
CLIP APPLIE ROT 10 11.4 M/L (STAPLE) ×1 IMPLANT
CLOTH BEACON ORANGE TIMEOUT ST (SAFETY) ×2 IMPLANT
COVER MAYO STAND STRL (DRAPES) IMPLANT
DECANTER SPIKE VIAL GLASS SM (MISCELLANEOUS) ×2 IMPLANT
DERMABOND ADVANCED (GAUZE/BANDAGES/DRESSINGS) ×1
DERMABOND ADVANCED .7 DNX12 (GAUZE/BANDAGES/DRESSINGS) ×1 IMPLANT
DRAPE C-ARM 42X72 X-RAY (DRAPES) ×2 IMPLANT
DRAPE UTILITY 15X26 W/TAPE STR (DRAPE) ×2 IMPLANT
ELECT REM PT RETURN 9FT ADLT (ELECTROSURGICAL) ×2
ELECTRODE REM PT RTRN 9FT ADLT (ELECTROSURGICAL) ×1 IMPLANT
GLOVE BIO SURGEON STRL SZ 6.5 (GLOVE) ×4 IMPLANT
GLOVE BIO SURGEON STRL SZ8 (GLOVE) ×2 IMPLANT
GLOVE BIOGEL PI IND STRL 8 (GLOVE) ×1 IMPLANT
GLOVE BIOGEL PI INDICATOR 8 (GLOVE) ×1
GLOVE INDICATOR 7.0 STRL GRN (GLOVE) ×4 IMPLANT
GOWN PREVENTION PLUS XLARGE (GOWN DISPOSABLE) ×6 IMPLANT
NS IRRIG 1000ML POUR BTL (IV SOLUTION) ×2 IMPLANT
PACK BASIN DAY SURGERY FS (CUSTOM PROCEDURE TRAY) ×2 IMPLANT
POUCH SPECIMEN RETRIEVAL 10MM (ENDOMECHANICALS) ×2 IMPLANT
SCISSORS LAP 5X35 DISP (ENDOMECHANICALS) ×2 IMPLANT
SET CHOLANGIOGRAPH 5 50 .035 (SET/KITS/TRAYS/PACK) ×2 IMPLANT
SET IRRIG TUBING LAPAROSCOPIC (IRRIGATION / IRRIGATOR) ×2 IMPLANT
SLEEVE ENDOPATH XCEL 5M (ENDOMECHANICALS) ×4 IMPLANT
SLEEVE SCD COMPRESS KNEE MED (MISCELLANEOUS) ×2 IMPLANT
SUT MNCRL AB 4-0 PS2 18 (SUTURE) ×2 IMPLANT
SUT VICRYL 0 UR6 27IN ABS (SUTURE) ×4 IMPLANT
TOWEL OR 17X24 6PK STRL BLUE (TOWEL DISPOSABLE) ×2 IMPLANT
TOWEL OR NON WOVEN STRL DISP B (DISPOSABLE) ×2 IMPLANT
TRAY LAPAROSCOPIC (CUSTOM PROCEDURE TRAY) ×2 IMPLANT
TROCAR XCEL BLUNT TIP 100MML (ENDOMECHANICALS) ×2 IMPLANT
TROCAR XCEL NON-BLD 11X100MML (ENDOMECHANICALS) ×2 IMPLANT
TROCAR XCEL NON-BLD 5MMX100MML (ENDOMECHANICALS) ×2 IMPLANT

## 2011-06-01 NOTE — Anesthesia Procedure Notes (Signed)
Procedure Name: Intubation Date/Time: 06/01/2011 11:50 AM Performed by: Aarron Wierzbicki D Pre-anesthesia Checklist: Patient identified, Emergency Drugs available, Suction available and Patient being monitored Patient Re-evaluated:Patient Re-evaluated prior to inductionOxygen Delivery Method: Circle System Utilized Preoxygenation: Pre-oxygenation with 100% oxygen Intubation Type: IV induction Ventilation: Mask ventilation without difficulty Grade View: Grade I Tube type: Oral Number of attempts: 1 Airway Equipment and Method: stylet and oral airway Placement Confirmation: ETT inserted through vocal cords under direct vision,  positive ETCO2 and breath sounds checked- equal and bilateral Secured at: 21 cm Tube secured with: Tape Dental Injury: Teeth and Oropharynx as per pre-operative assessment

## 2011-06-01 NOTE — Discharge Instructions (Signed)
[facility] ______[facility] °LAPAROSCOPIC SURGERY: POST OP INSTRUCTIONS °Always review your discharge instruction sheet given to you by the facility where your surgery was performed. °IF YOU HAVE DISABILITY OR FAMILY LEAVE FORMS, YOU MUST BRING THEM TO THE OFFICE FOR PROCESSING.   °DO NOT GIVE THEM TO YOUR DOCTOR. ° °1. A prescription for pain medication may be given to you upon discharge.  Take your pain medication as prescribed, if needed.  If narcotic pain medicine is not needed, then you may take acetaminophen (Tylenol) or ibuprofen (Advil) as needed. °2. Take your usually prescribed medications unless otherwise directed. °3. If you need a refill on your pain medication, please contact your pharmacy.  They will contact our office to request authorization. Prescriptions will not be filled after 5pm or on week-ends. °4. You should follow a light diet the first few days after arrival home, such as soup and crackers, etc.  Be sure to include lots of fluids daily. °5. Most patients will experience some swelling and bruising in the area of the incisions.  Ice packs will help.  Swelling and bruising can take several days to resolve.  °6. It is common to experience some constipation if taking pain medication after surgery.  Increasing fluid intake and taking a stool softener (such as Colace) will usually help or prevent this problem from occurring.  A mild laxative (Milk of Magnesia or Miralax) should be taken according to package instructions if there are no bowel movements after 48 hours. °7. Unless discharge instructions indicate otherwise, you may remove your bandages 24-48 hours after surgery, and you may shower at that time.  You may have steri-strips (small skin tapes) in place directly over the incision.  These strips should be left on the skin for 7-10 days.  If your surgeon used skin glue on the incision, you may shower in 24 hours.  The glue will flake off over the next 2-3 weeks.  Any sutures or  staples will be removed at the office during your follow-up visit. °8. ACTIVITIES:  You may resume regular (light) daily activities beginning the next day--such as daily self-care, walking, climbing stairs--gradually increasing activities as tolerated.  You may have sexual intercourse when it is comfortable.  Refrain from any heavy lifting or straining until approved by your doctor. °a. You may drive when you are no longer taking prescription pain medication, you can comfortably wear a seatbelt, and you can safely maneuver your car and apply brakes. °b. RETURN TO WORK:  __________________________________________________________ °9. You should see your doctor in the office for a follow-up appointment approximately 2-3 weeks after your surgery.  Make sure that you call for this appointment within a day or two after you arrive home to insure a convenient appointment time. °10. OTHER INSTRUCTIONS: __________________________________________________________________________________________________________________________ __________________________________________________________________________________________________________________________ °WHEN TO CALL YOUR DOCTOR: °1. Fever over 101.0 °2. Inability to urinate °3. Continued bleeding from incision. °4. Increased pain, redness, or drainage from the incision. °5. Increasing abdominal pain ° °The clinic staff is available to answer your questions during regular business hours.  Please don’t hesitate to call and ask to speak to one of the nurses for clinical concerns.  If you have a medical emergency, go to the nearest emergency room or call 911.  A surgeon from [facility] is always on call at the hospital. °[address], [address], [place], [place]  [address] ? [address] [address], [place],    [address] °[phone] ? [phone] ? FAX [phone] °Web site:   [email or web address] ° ° °Post Anesthesia Home Care Instructions ° °Activity: °Get  plenty of rest for the remainder of the day. A responsible adult should stay with you for 24 hours following the procedure.  °For the next 24 hours, DO NOT: °-Drive a car °-Operate machinery °-Drink alcoholic beverages °-Take any medication unless instructed by your physician °-Make any legal decisions or sign important papers. ° °Meals: °Start with liquid foods such as gelatin or soup. Progress to regular foods as tolerated. Avoid greasy, spicy, heavy foods. If nausea and/or vomiting occur, drink only clear liquids until the nausea and/or vomiting subsides. Call your physician if vomiting continues. ° °Special Instructions/Symptoms: °Your throat may feel dry or sore from the anesthesia or the breathing tube placed in your throat during surgery. If this causes discomfort, gargle with warm salt water. The discomfort should disappear within 24 hours. ° °

## 2011-06-01 NOTE — Anesthesia Postprocedure Evaluation (Signed)
Anesthesia Post Note  Patient: Diane Nolan  Procedure(s) Performed: Procedure(s) (LRB): LAPAROSCOPIC CHOLECYSTECTOMY WITH INTRAOPERATIVE CHOLANGIOGRAM (N/A)  Anesthesia type: General  Patient location: PACU  Post pain: Pain level controlled  Post assessment: Patient's Cardiovascular Status Stable  Last Vitals:  Filed Vitals:   06/01/11 1518  BP:   Pulse: 61  Temp:   Resp: 14    Post vital signs: Reviewed and stable  Level of consciousness: alert  Complications: No apparent anesthesia complications

## 2011-06-01 NOTE — H&P (View-Only) (Signed)
Patient ID: Diane Nolan, female   DOB: 05/17/1928, 78 y.o.   MRN: [id number]  Chief Complaint  Patient presents with  . New Evaluation    eval GB    HPI Diane Nolan is a 78 y.o. female.          HPI  Patient presents at the request of Dr. Su Hilt due to right upper quadrant pain. She has had it for one month. It waxes and wanes. She was seen in the emergency room one week ago for a severe exacerbation of right upper quadrant pain. It is sharp in nature. The symptoms wax and wane. Nothing seems to make it worse or better. Ultrasound was obtained which shows gallstones. Liver function studies and CBC were normal. The pain is now gone. Past Medical History  Diagnosis Date  . Diabetes mellitus     Past Surgical History  Procedure Date  . Child birth X1    No family history on file.  Social History History  Substance Use Topics  . Smoking status: Never Smoker   . Smokeless tobacco: Not on file  . Alcohol Use: No    No Known Allergies  Current Outpatient Prescriptions  Medication Sig Dispense Refill  . atenolol (TENORMIN) 25 MG tablet Take 25 mg by mouth daily.      . cephALEXin (KEFLEX) 500 MG capsule Take 1 capsule (500 mg total) by mouth 4 (four) times daily.  28 capsule  0  . HYDROcodone-acetaminophen (VICODIN) 5-500 MG per tablet Take 1 tablet by mouth every 6 (six) hours as needed. For pain.      Marland Kitchen levothyroxine (SYNTHROID, LEVOTHROID) 50 MCG tablet Take 50 mcg by mouth daily.      . metFORMIN (GLUCOPHAGE) 500 MG tablet Take 500 mg by mouth 2 (two) times daily with a meal.      . Multiple Vitamin (MULITIVITAMIN WITH MINERALS) TABS Take 1 tablet by mouth daily.      . simvastatin (ZOCOR) 20 MG tablet Take 20 mg by mouth every evening.        Review of Systems Review of Systems  Constitutional: Negative for fever, chills and unexpected weight change.  HENT: Negative for hearing loss, congestion, sore throat, trouble swallowing and voice change.   Eyes: Negative for  visual disturbance.  Respiratory: Negative for cough and wheezing.   Cardiovascular: Negative for chest pain, palpitations and leg swelling.  Gastrointestinal: Negative for nausea, vomiting, abdominal pain, diarrhea, constipation, blood in stool, abdominal distention and anal bleeding.  Genitourinary: Negative for hematuria, vaginal bleeding and difficulty urinating.  Musculoskeletal: Negative for arthralgias.  Skin: Negative for rash and wound.  Neurological: Negative for seizures, syncope and headaches.  Hematological: Negative for adenopathy. Does not bruise/bleed easily.  Psychiatric/Behavioral: Negative for confusion.    Blood pressure 142/70, pulse 75, temperature 97 F (36.1 C), temperature source Temporal, resp. rate 16, height 4\' 11"  (1.499 m), weight 157 lb 3.2 oz (71.305 kg).  Physical Exam Physical Exam  Constitutional: She is oriented to person, place, and time. She appears well-developed and well-nourished.  HENT:  Head: Normocephalic and atraumatic.  Eyes: EOM are normal. Pupils are equal, round, and reactive to light.  Neck: Normal range of motion. Neck supple.  Cardiovascular: Normal rate and regular rhythm.   Pulmonary/Chest: Effort normal and breath sounds normal.  Abdominal: Soft. Bowel sounds are normal. There is no tenderness.  Musculoskeletal: Normal range of motion.  Neurological: She is alert and oriented to person, place, and time.  Skin: Skin is  warm and dry.  Psychiatric: She has a normal mood and affect. Her behavior is normal. Judgment and thought content normal.    Data Reviewed Gallstones normal lft's   Assessment    Symptomatic cholelithiasis.    Plan LAPAROSCOPIC CHOLECYSTECTOMY    The procedure has been discussed with the patient. Operative and non operative treatments have been discussed. Risks of surgery include bleeding, infection,  Common bile duct injury,  Injury to the stomach,liver, colon,small intestine, abdominal wall,  Diaphragm,   Major blood vessels,  And the need for an open procedure.  Other risks include worsening of medical problems, death,  DVT and pulmonary embolism, and cardiovascular events.   Medical options have also been discussed. The patient has been informed of long term expectations of surgery and non surgical options,  The patient agrees to proceed.         Calli Bashor A. 05/06/2011, 11:19 AM

## 2011-06-01 NOTE — Interval H&P Note (Signed)
History and Physical Interval Note:  06/01/2011 1:08 PM  Diane Nolan  has presented today for surgery, with the diagnosis of Gallstones  The various methods of treatment have been discussed with the patient and family. After consideration of risks, benefits and other options for treatment, the patient has consented to  Procedure(s) (LRB): LAPAROSCOPIC CHOLECYSTECTOMY WITH INTRAOPERATIVE CHOLANGIOGRAM (N/A) as a surgical intervention .  The patients' history has been reviewed, patient examined, no change in status, stable for surgery.  I have reviewed the patients' chart and labs.  Questions were answered to the patient's satisfaction.     Ranata Laughery A.

## 2011-06-01 NOTE — Transfer of Care (Signed)
Immediate Anesthesia Transfer of Care Note  Patient: Diane Nolan  Procedure(s) Performed: Procedure(s) (LRB): LAPAROSCOPIC CHOLECYSTECTOMY WITH INTRAOPERATIVE CHOLANGIOGRAM (N/A)  Patient Location: PACU  Anesthesia Type: General  Level of Consciousness: awake, alert , oriented and patient cooperative  Airway & Oxygen Therapy: Patient Spontanous Breathing and Patient connected to face mask oxygen  Post-op Assessment: Report given to PACU RN and Post -op Vital signs reviewed and stable  Post vital signs: Reviewed and stable  Complications: No apparent anesthesia complications

## 2011-06-01 NOTE — Anesthesia Preprocedure Evaluation (Signed)
Anesthesia Evaluation  Patient identified by MRN, date of birth, ID band Patient awake    Reviewed: Allergy & Precautions, H&P , NPO status , Patient's Chart, lab work & pertinent test results, reviewed documented beta blocker date and time   Airway Mallampati: II TM Distance: >3 FB Neck ROM: full    Dental   Pulmonary neg pulmonary ROS,          Cardiovascular hypertension, On Medications     Neuro/Psych negative neurological ROS  negative psych ROS   GI/Hepatic negative GI ROS, Neg liver ROS,   Endo/Other  Diabetes mellitus-  Renal/GU negative Renal ROS  negative genitourinary   Musculoskeletal   Abdominal   Peds  Hematology negative hematology ROS (+)   Anesthesia Other Findings See surgeon's H&P   Reproductive/Obstetrics negative OB ROS                           Anesthesia Physical Anesthesia Plan  ASA: II  Anesthesia Plan: General   Post-op Pain Management:    Induction: Intravenous  Airway Management Planned: Oral ETT  Additional Equipment:   Intra-op Plan:   Post-operative Plan: Extubation in OR  Informed Consent: I have reviewed the patients History and Physical, chart, labs and discussed the procedure including the risks, benefits and alternatives for the proposed anesthesia with the patient or authorized representative who has indicated his/her understanding and acceptance.   Dental Advisory Given  Plan Discussed with: CRNA and Surgeon  Anesthesia Plan Comments:         Anesthesia Quick Evaluation

## 2011-06-01 NOTE — Op Note (Signed)
Laparoscopic Cholecystectomy with IOC Procedure Note  Indications: This patient presents with symptomatic gallbladder disease and will undergo laparoscopic cholecystectomy.  Pre-operative Diagnosis: Calculus of gallbladder with acute cholecystitis, without mention of obstruction  Post-operative Diagnosis: Same  Surgeon: Lars Jeziorski A.   Assistants: or staff  Anesthesia: General endotracheal anesthesia and Local anesthesia 0.25.% bupivacaine, with epinephrine  ASA Class: 3  Procedure Details  The patient was seen again in the Holding Room. The risks, benefits, complications, treatment options, and expected outcomes were discussed with the patient. The possibilities of reaction to medication, pulmonary aspiration, perforation of viscus, bleeding, recurrent infection, finding a normal gallbladder, the need for additional procedures, failure to diagnose a condition, the possible need to convert to an open procedure, and creating a complication requiring transfusion or operation were discussed with the patient. The patient and/or family concurred with the proposed plan, giving informed consent. The site of surgery properly noted/marked. The patient was taken to Operating Room, identified as Diane Nolan and the procedure verified as Laparoscopic Cholecystectomy with Intraoperative Cholangiograms. A Time Out was held and the above information confirmed.  Prior to the induction of general anesthesia, antibiotic prophylaxis was administered. General endotracheal anesthesia was then administered and tolerated well. After the induction, the abdomen was prepped in the usual sterile fashion. The patient was positioned in the supine position with the left arm comfortably tucked, along with some reverse Trendelenburg.  Local anesthetic agent was injected into the skin near the umbilicus and an incision made. The midline fascia was incised and the Hasson technique was used to introduce a 12 mm port under  direct vision. It was secured with a figure of eight Vicryl suture placed in the usual fashion. Pneumoperitoneum was then created with CO2 and tolerated well without any adverse changes in the patient's vital signs. Additional trocars were introduced under direct vision. All skin incisions were infiltrated with a local anesthetic agent before making the incision and placing the trocars.   The gallbladder was identified, the fundus grasped and retracted cephalad. Adhesions were lysed bluntly and with the electrocautery where indicated, taking care not to injure any adjacent organs or viscus. The infundibulum was grasped and retracted laterally, exposing the peritoneum overlying the triangle of Calot. This was then divided and exposed in a blunt fashion. The cystic duct was clearly identified and bluntly dissected circumferentially. The junctions of the gallbladder, cystic duct and common bile duct were clearly identified prior to the division of any linear structure.   An incision was made in the cystic duct and the cholangiogram catheter introduced. The catheter was secured using an endoclip. The study showed no stones and good visualization of the distal and proximal biliary tree. The catheter was then removed.   The cystic duct was then  ligated with surgical clips  on the patient side and  clipped on the gallbladder side and divided. The cystic artery was identified, dissected free, ligated with clips and divided as well.  Posterior branch of cystic artery was clipped and divided.  The gallbladder was dissected from the liver bed in retrograde fashion with the electrocautery. The gallbladder was removed. The liver bed was irrigated and inspected. Hemostasis was achieved with the electrocautery. Copious irrigation was utilized and was repeatedly aspirated until clear all particulate matter. The gallbladder was removed at the umbilicus with endocatch bag.   Pneumoperitoneum was completely reduced after  viewing removal of the trocars under direct vision. The wound was thoroughly irrigated and the fascia was then closed with a  figure of eight suture; the skin was then closed with 4 o monocryl and a sterile dermabond was applied.  Instrument, sponge, and needle counts were correct at closure and at the conclusion of the case.   Findings: Cholecystitis without Cholelithiasis  Estimated Blood Loss: less than 50 mL         Drains: none         Total IV Fluids: 800 mL         Specimens: Gallbladder           Complications: None; patient tolerated the procedure well.         Disposition: PACU - hemodynamically stable.         Condition: stable

## 2011-06-03 ENCOUNTER — Encounter (HOSPITAL_BASED_OUTPATIENT_CLINIC_OR_DEPARTMENT_OTHER): Payer: Self-pay | Admitting: Surgery

## 2011-06-14 ENCOUNTER — Encounter (INDEPENDENT_AMBULATORY_CARE_PROVIDER_SITE_OTHER): Payer: Medicare Other | Admitting: Surgery

## 2011-06-22 ENCOUNTER — Encounter (INDEPENDENT_AMBULATORY_CARE_PROVIDER_SITE_OTHER): Payer: Self-pay | Admitting: Surgery

## 2011-06-22 ENCOUNTER — Ambulatory Visit (INDEPENDENT_AMBULATORY_CARE_PROVIDER_SITE_OTHER): Payer: Medicare Other | Admitting: Surgery

## 2011-06-22 VITALS — BP 126/70 | HR 80 | Temp 97.7°F | Resp 16 | Ht 60.5 in | Wt 152.0 lb

## 2011-06-22 DIAGNOSIS — Z9889 Other specified postprocedural states: Secondary | ICD-10-CM

## 2011-06-22 NOTE — Patient Instructions (Signed)
Resume full activity  

## 2011-06-22 NOTE — Progress Notes (Signed)
Patient returns after laparoscopic cholecystectomy and cholangiogram. She is doing well. Exam: Wound clean dry intact abdomen soft nontender  Impression: Status post laparoscopic cholecystectomy cholangiogram for symptomatic cholelithiasis  Plan: Resume full activity. Followup as needed.

## 2012-08-09 ENCOUNTER — Encounter: Payer: Self-pay | Admitting: Cardiovascular Disease

## 2012-08-10 ENCOUNTER — Encounter: Payer: Self-pay | Admitting: Cardiovascular Disease

## 2014-04-22 DIAGNOSIS — E1165 Type 2 diabetes mellitus with hyperglycemia: Secondary | ICD-10-CM | POA: Diagnosis not present

## 2016-01-04 ENCOUNTER — Observation Stay (HOSPITAL_COMMUNITY)
Admission: EM | Admit: 2016-01-04 | Discharge: 2016-01-05 | Disposition: A | Discharge status: Home / Self Care | Payer: Medicare Other | Attending: Internal Medicine | Admitting: Internal Medicine

## 2016-01-04 ENCOUNTER — Encounter (HOSPITAL_COMMUNITY): Payer: Self-pay | Admitting: Emergency Medicine

## 2016-01-04 ENCOUNTER — Emergency Department (HOSPITAL_COMMUNITY): Payer: Medicare Other

## 2016-01-04 ENCOUNTER — Observation Stay (HOSPITAL_BASED_OUTPATIENT_CLINIC_OR_DEPARTMENT_OTHER): Payer: Medicare Other

## 2016-01-04 DIAGNOSIS — Z79899 Other long term (current) drug therapy: Secondary | ICD-10-CM | POA: Diagnosis not present

## 2016-01-04 DIAGNOSIS — E119 Type 2 diabetes mellitus without complications: Secondary | ICD-10-CM | POA: Insufficient documentation

## 2016-01-04 DIAGNOSIS — Z7984 Long term (current) use of oral hypoglycemic drugs: Secondary | ICD-10-CM | POA: Diagnosis not present

## 2016-01-04 DIAGNOSIS — R079 Chest pain, unspecified: Secondary | ICD-10-CM

## 2016-01-04 DIAGNOSIS — I10 Essential (primary) hypertension: Secondary | ICD-10-CM | POA: Diagnosis not present

## 2016-01-04 LAB — COMPREHENSIVE METABOLIC PANEL
ALT: 6 U/L — ABNORMAL LOW (ref 14–54)
AST: 14 U/L — ABNORMAL LOW (ref 15–41)
Albumin: 2.5 g/dL — ABNORMAL LOW (ref 3.5–5.0)
Alkaline Phosphatase: 49 U/L (ref 38–126)
Anion gap: 6 (ref 5–15)
BUN: 15 mg/dL (ref 6–20)
CO2: 19 mmol/L — ABNORMAL LOW (ref 22–32)
Calcium: 6.8 mg/dL — ABNORMAL LOW (ref 8.9–10.3)
Chloride: 114 mmol/L — ABNORMAL HIGH (ref 101–111)
Creatinine, Ser: 0.89 mg/dL (ref 0.44–1.00)
GFR calc Af Amer: >60 mL/min (ref >60)
GFR calc non Af Amer: 57 mL/min — ABNORMAL LOW (ref >60)
Glucose, Bld: 91 mg/dL (ref 65–99)
Potassium: 3.7 mmol/L (ref 3.5–5.1)
Sodium: 139 mmol/L (ref 135–145)
Total Bilirubin: 0.8 mg/dL (ref 0.3–1.2)
Total Protein: 4.6 g/dL — ABNORMAL LOW (ref 6.5–8.1)

## 2016-01-04 LAB — CBC WITH DIFFERENTIAL/PLATELET
Basophils Absolute: 0 10*3/uL (ref 0.0–0.1)
Basophils Relative: 0 %
Eosinophils Absolute: 0 10*3/uL (ref 0.0–0.7)
Eosinophils Relative: 1 %
HCT: 33.8 % — ABNORMAL LOW (ref 36.0–46.0)
Hemoglobin: 11.3 g/dL — ABNORMAL LOW (ref 12.0–15.0)
Lymphocytes Relative: 17 %
Lymphs Abs: 1.1 10*3/uL (ref 0.7–4.0)
MCH: 30.5 pg (ref 26.0–34.0)
MCHC: 33.4 g/dL (ref 30.0–36.0)
MCV: 91.1 fL (ref 78.0–100.0)
Monocytes Absolute: 0.3 10*3/uL (ref 0.1–1.0)
Monocytes Relative: 5 %
Neutro Abs: 5.2 10*3/uL (ref 1.7–7.7)
Neutrophils Relative %: 77 %
Platelets: 167 10*3/uL (ref 150–400)
RBC: 3.71 MIL/uL — ABNORMAL LOW (ref 3.87–5.11)
RDW: 14.5 % (ref 11.5–15.5)
WBC: 6.7 10*3/uL (ref 4.0–10.5)

## 2016-01-04 LAB — NM MYOCAR MULTI W/SPECT W/WALL MOTION / EF
LV dias vol: 60 mL (ref 46–106)
LV sys vol: 14 mL
RATE: 0
Rest HR: 62 {beats}/min
SDS: 2
SRS: 5
SSS: 7
TID: 1.43

## 2016-01-04 LAB — D-DIMER, QUANTITATIVE: D-Dimer, Quant: 0.46 ug/mL-FEU (ref 0.00–0.50)

## 2016-01-04 LAB — TROPONIN I
Troponin I: <0.03 ng/mL (ref <0.03)
Troponin I: <0.03 ng/mL (ref <0.03)
Troponin I: <0.03 ng/mL

## 2016-01-04 LAB — I-STAT TROPONIN, ED: Troponin i, poc: 0 ng/mL (ref 0.00–0.08)

## 2016-01-04 LAB — TSH: TSH: 1.498 u[IU]/mL (ref 0.350–4.500)

## 2016-01-04 LAB — LIPASE, BLOOD: Lipase: 25 U/L (ref 11–51)

## 2016-01-04 MED ORDER — TECHNETIUM TC 99M TETROFOSMIN IV KIT
30.0000 | PACK | Freq: Once | INTRAVENOUS | Status: AC | PRN
  Administered 2016-01-04: 30 via INTRAVENOUS

## 2016-01-04 MED ORDER — NITROGLYCERIN 0.4 MG SL SUBL
0.4000 mg | SUBLINGUAL_TABLET | SUBLINGUAL | Status: DC | PRN
  Filled 2016-01-04: qty 1

## 2016-01-04 MED ORDER — REGADENOSON 0.4 MG/5ML IV SOLN
INTRAVENOUS | Status: AC
  Administered 2016-01-04: 0.4 mg
  Filled 2016-01-04: qty 5

## 2016-01-04 MED ORDER — ENSURE ENLIVE PO LIQD
237.0000 mL | Freq: Two times a day (BID) | ORAL | Status: DC
  Administered 2016-01-04 – 2016-01-05 (×2): 237 mL via ORAL

## 2016-01-04 MED ORDER — LISINOPRIL 5 MG PO TABS
5.0000 mg | ORAL_TABLET | Freq: Every day | ORAL | Status: DC
  Administered 2016-01-04 – 2016-01-05 (×2): 5 mg via ORAL
  Filled 2016-01-04 (×2): qty 1

## 2016-01-04 MED ORDER — ONDANSETRON HCL 4 MG/2ML IJ SOLN: 4.0000 mg | Freq: Four times a day (QID) | INTRAMUSCULAR | Status: DC | PRN

## 2016-01-04 MED ORDER — SIMVASTATIN 20 MG PO TABS
20.0000 mg | ORAL_TABLET | Freq: Every evening | ORAL | Status: DC
Start: 2016-01-04 — End: 2016-01-05
  Administered 2016-01-04: 20 mg via ORAL
  Filled 2016-01-04: qty 1

## 2016-01-04 MED ORDER — ACETAMINOPHEN 325 MG PO TABS
650.0000 mg | ORAL_TABLET | ORAL | Status: DC | PRN
  Administered 2016-01-04: 650 mg via ORAL
  Filled 2016-01-04: qty 2

## 2016-01-04 MED ORDER — LORAZEPAM 1 MG PO TABS
2.0000 mg | ORAL_TABLET | Freq: Every day | ORAL | Status: DC
  Administered 2016-01-04: 2 mg via ORAL
  Filled 2016-01-04: qty 2

## 2016-01-04 MED ORDER — METOPROLOL SUCCINATE ER 25 MG PO TB24
25.0000 mg | ORAL_TABLET | Freq: Every day | ORAL | Status: DC
  Administered 2016-01-04 – 2016-01-05 (×2): 25 mg via ORAL
  Filled 2016-01-04 (×2): qty 1

## 2016-01-04 MED ORDER — METFORMIN HCL 500 MG PO TABS: 500.0000 mg | ORAL_TABLET | Freq: Every day | ORAL | Status: DC

## 2016-01-04 MED ORDER — ADULT MULTIVITAMIN W/MINERALS CH: 1.0000 | ORAL_TABLET | Freq: Every day | ORAL | Status: DC

## 2016-01-04 MED ORDER — TECHNETIUM TC 99M TETROFOSMIN IV KIT
10.0000 | PACK | Freq: Once | INTRAVENOUS | Status: AC | PRN
  Administered 2016-01-04: 10 via INTRAVENOUS

## 2016-01-04 MED ORDER — ENOXAPARIN SODIUM 40 MG/0.4ML ~~LOC~~ SOLN
40.0000 mg | SUBCUTANEOUS | Status: DC
  Administered 2016-01-05: 40 mg via SUBCUTANEOUS
  Filled 2016-01-04: qty 0.4

## 2016-01-04 NOTE — Plan of Care (Signed)
Problem: Education: Goal: Knowledge of [facility] Education information/materials will improve Outcome: Progressing Patient aware of plan of care.     

## 2016-01-04 NOTE — ED Notes (Signed)
EDP at bedside  

## 2016-01-04 NOTE — Progress Notes (Signed)
[facility] PROGRESS NOTE  Diane Nolan OZH:[id number] DOB: Oct 25, 1928 DOA: 01/04/2016   PCP: Lorenda Peck, MD     Assessment/Plan: Principal Problem:   Chest pain, rule out acute myocardial infarction Active Problems:   HTN (hypertension)   DM2 (diabetes mellitus, type 2) (HCC)  82 y.o. female with medical history significant of DM, HTN, HLD, smoking.  Patient presents to the ED with c/o sudden onset, moderate in severity, chest pain.  Symptoms onset earlier tonight.  Pain is aching in quality.  Given 324 ASA and 1 NTG en route to ED with relief.   Assessment/Plan Chest pain with multiple risk factors, HTN, DM, HLD, age.  troponins are neg but abnormal EKG  --s/p nuc study , fixed defect but low risk study , will keep npo after midnight for further recommendations, given on going chest pain. D-dimer negative   SVT- rate 150 possible a flutter. Continue tele, metoprolol  HTN - controlled, though elevated this AM  DM stable ,   HLD- on Zocor   DVT prophylaxsis lovenox  Code Status:  Full code     Family Communication: Discussed in detail with the patient, all imaging results, lab results explained to the patient   Disposition Plan:in am       Consultants: Cardiology   Procedures:  lexiscan Small size, moderate intensity fixed basal inferolateral defect likely attenuation artifact. No significant reversible ischemia. LVEF 77% with normal wall motion. This is a low risk study.  Antibiotics: Anti-infectives    None         HPI/Subjective: Concerned about ongoing chest pain despite being in bed   Objective: Vitals:   01/04/16 1344 01/04/16 1444 01/04/16 1736 01/04/16 2057  BP: 125/65 131/64 130/69 128/75  Pulse: 63 62 68 65  Resp: 12 14 14    Temp: 97.9 F (36.6 C) 97.9 F (36.6 C) 97.8 F (36.6 C) 98.2 F (36.8 C)  TempSrc: Oral Oral Oral Oral  SpO2: 99% 97% 99% 99%    Intake/Output Summary (Last 24 hours) at 01/04/16  2127 Last data filed at 01/04/16 1800  Gross per 24 hour  Intake              240 ml  Output              700 ml  Net             -460 ml    Exam:  Examination:  General exam: Appears calm and comfortable  Respiratory system: Clear to auscultation. Respiratory effort normal. Cardiovascular system: S1 & S2 heard, RRR. No JVD, murmurs, rubs, gallops or clicks. No pedal edema. Gastrointestinal system: Abdomen is nondistended, soft and nontender. No organomegaly or masses felt. Normal bowel sounds heard. Central nervous system: Alert and oriented. No focal neurological deficits. Extremities: Symmetric 5 x 5 power. Skin: No rashes, lesions or ulcers Psychiatry: Judgement and insight appear normal. Mood & affect appropriate.     Data Reviewed: I have personally reviewed following labs and imaging studies  Micro Results No results found for this or any previous visit (from the past 240 hour(s)).  Radiology Reports Nm Myocar Multi W/spect W/wall Motion / Ef  Result Date: 01/04/2016  There was no ST segment deviation noted during stress.  No T wave inversion was noted during stress.  Defect 1: There is a small defect of moderate severity.  This is a low risk study.  Nuclear stress EF: 77%.  Small size, moderate intensity  fixed basal inferolateral defect likely attenuation artifact. No significant reversible ischemia. LVEF 77% with normal wall motion. This is a low risk study.   Dg Chest Portable 1 View  Result Date: 01/04/2016 CLINICAL DATA:  Recurrent midsternal chest pain beginning at 11 p.m. History of diabetes and hypertension. EXAM: PORTABLE CHEST 1 VIEW COMPARISON:  Chest radiograph May 31, 2015 FINDINGS: Cardiac silhouette is mildly enlarged. Mediastinal silhouette is nonsuspicious, mildly calcified aortic knob. No pleural effusion or focal consolidation. No pneumothorax. Broad dextroscoliosis may be in part positional as, more conspicuous than prior radiograph. Soft tissue  planes are nonsuspicious. Multiple skin folds. IMPRESSION: Mild cardiomegaly, no acute pulmonary process. Electronically Signed   By: Awilda Metroourtnay  Bloomer M.D.   On: 01/04/2016 02:32     CBC  Recent Labs Lab 01/04/16 0112  WBC 6.7  HGB 11.3*  HCT 33.8*  PLT 167  MCV 91.1  MCH 30.5  MCHC 33.4  RDW 14.5  LYMPHSABS 1.1  MONOABS 0.3  EOSABS 0.0  BASOSABS 0.0    Chemistries   Recent Labs Lab 01/04/16 0217  NA 139  K 3.7  CL 114*  CO2 19*  GLUCOSE 91  BUN 15  CREATININE 0.89  CALCIUM 6.8*  AST 14*  ALT 6*  ALKPHOS 49  BILITOT 0.8   ------------------------------------------------------------------------------------------------------------------ CrCl cannot be calculated (Unknown ideal weight.). ------------------------------------------------------------------------------------------------------------------ No results for input(s): HGBA1C in the last 72 hours. ------------------------------------------------------------------------------------------------------------------ No results for input(s): CHOL, HDL, LDLCALC, TRIG, CHOLHDL, LDLDIRECT in the last 72 hours. ------------------------------------------------------------------------------------------------------------------  Recent Labs  01/04/16 0500  TSH 1.498   ------------------------------------------------------------------------------------------------------------------ No results for input(s): VITAMINB12, FOLATE, FERRITIN, TIBC, IRON, RETICCTPCT in the last 72 hours.  Coagulation profile No results for input(s): INR, PROTIME in the last 168 hours.   Recent Labs  01/04/16 1250  DDIMER 0.46    Cardiac Enzymes  Recent Labs Lab 01/04/16 0500 01/04/16 1133 01/04/16 1628  TROPONINI <0.03 <0.03 <0.03   ------------------------------------------------------------------------------------------------------------------ Invalid input(s): POCBNP   CBG: No results for input(s): GLUCAP in the last 168  hours.     Studies: Nm Myocar Multi W/spect W/wall Motion / Ef  Result Date: 01/04/2016  There was no ST segment deviation noted during stress.  No T wave inversion was noted during stress.  Defect 1: There is a small defect of moderate severity.  This is a low risk study.  Nuclear stress EF: 77%.  Small size, moderate intensity fixed basal inferolateral defect likely attenuation artifact. No significant reversible ischemia. LVEF 77% with normal wall motion. This is a low risk study.   Dg Chest Portable 1 View  Result Date: 01/04/2016 CLINICAL DATA:  Recurrent midsternal chest pain beginning at 11 p.m. History of diabetes and hypertension. EXAM: PORTABLE CHEST 1 VIEW COMPARISON:  Chest radiograph May 31, 2015 FINDINGS: Cardiac silhouette is mildly enlarged. Mediastinal silhouette is nonsuspicious, mildly calcified aortic knob. No pleural effusion or focal consolidation. No pneumothorax. Broad dextroscoliosis may be in part positional as, more conspicuous than prior radiograph. Soft tissue planes are nonsuspicious. Multiple skin folds. IMPRESSION: Mild cardiomegaly, no acute pulmonary process. Electronically Signed   By: Awilda Metroourtnay  Bloomer M.D.   On: 01/04/2016 02:32      No results found for: HGBA1C Lab Results  Component Value Date   CREATININE 0.89 01/04/2016       Scheduled Meds: . enoxaparin (LOVENOX) injection  40 mg Subcutaneous Q24H  . feeding supplement (ENSURE ENLIVE)  237 mL Oral BID BM  . lisinopril  5 mg Oral Daily  .  LORazepam  2 mg Oral QHS  . metoprolol succinate  25 mg Oral Daily  . simvastatin  20 mg Oral QPM   Continuous Infusions:   LOS: 0 days    Time spent: >30 MINS    Prairie Lakes HospitalBROL,Marieanne Marxen  [facility] Pager [phone]. If 7PM-7AM, please contact night-coverage at [email or web address], password [email or web address] 01/04/2016, 9:27 PM  LOS: 0 days

## 2016-01-04 NOTE — Consult Note (Signed)
Reason for Consult: chest pain   Referring Physician: Dr. Allyson Sabal   PCP:  Myriam Jacobson, MD  Primary Cardiologist: NEW  Diane Nolan is an 82 y.o. female.    Chief Complaint: admitted 01/04/16 after presenting with midsternal chest pain.    HPI:  62 yof admitted early AM hours today with chest pain that began around 10 pm to 11PM. ASA and NTG sl mostly with relief.  She has had similar chest pain, usually occurring at rest and she has tachycardia first.   She called 911 last night because it was more intense than usual.  She had no associated symptoms of nausea, SOB or diaphoresis. She has a hx of DM, HLD and HTN.   No prior hx of CAD no hx CVA  EKG with mild ST elevation in V2-3, troponins are neg. 0.00 and < 0.03.  Cr. Stable at 0.89, TSH 1.498, CXR mild cardiomegaly.   Currently I woke from sleep, she has not rested much last night or the night before.  No current chest pain.   Past Medical History:  Diagnosis Date  . Diabetes mellitus   . HLD (hyperlipidemia)   . Hypertension   . Intracervical pessary    in place-dr henley manages    Past Surgical History:  Procedure Laterality Date  . CHILD BIRTH  X1  . CHOLECYSTECTOMY  06/01/2011   Procedure: LAPAROSCOPIC CHOLECYSTECTOMY WITH INTRAOPERATIVE CHOLANGIOGRAM;  Surgeon: Joyice Faster. Cornett, MD;  Location: [facility];  Service: General;  Laterality: N/A;  Laparoscopic cholecystectomy with cholangiogram    Family History  Problem Relation Age of Onset  . Other Mother     Old Age  . Diabetes Father    Social History:  reports that she has never smoked. She has never used smokeless tobacco. She reports that she does not drink alcohol or use drugs.  Allergies: No Known Allergies  OUTPATIENT MEDICATIONS: No current facility-administered medications on file prior to encounter.    Current Outpatient Prescriptions on File Prior to Encounter  Medication Sig Dispense Refill  . metFORMIN  (GLUCOPHAGE) 500 MG tablet Take 500 mg by mouth daily with breakfast.     . Multiple Vitamin (MULITIVITAMIN WITH MINERALS) TABS Take 1 tablet by mouth daily.    . simvastatin (ZOCOR) 20 MG tablet Take 20 mg by mouth every evening.    Marland Kitchen levothyroxine (SYNTHROID, LEVOTHROID) 50 MCG tablet Take 50 mcg by mouth daily.     CURRENT MEDICATIONS: Scheduled Meds: . enoxaparin (LOVENOX) injection  40 mg Subcutaneous Q24H  . feeding supplement (ENSURE ENLIVE)  237 mL Oral BID BM  . lisinopril  5 mg Oral Daily  . LORazepam  2 mg Oral QHS  . metoprolol succinate  25 mg Oral Daily  . simvastatin  20 mg Oral QPM   Continuous Infusions: PRN Meds:.acetaminophen, nitroGLYCERIN, ondansetron (ZOFRAN) IV   Results for orders placed or performed during the hospital encounter of 01/04/16 (from the past 48 hour(s))  CBC with Differential/Platelet     Status: Abnormal   Collection Time: 01/04/16  1:12 AM  Result Value Ref Range   WBC 6.7 4.0 - 10.5 K/uL   RBC 3.71 (L) 3.87 - 5.11 MIL/uL   Hemoglobin 11.3 (L) 12.0 - 15.0 g/dL   HCT 33.8 (L) 36.0 - 46.0 %   MCV 91.1 78.0 - 100.0 fL   MCH 30.5 26.0 - 34.0 pg   MCHC 33.4 30.0 - 36.0 g/dL  RDW 14.5 11.5 - 15.5 %   Platelets 167 150 - 400 K/uL   Neutrophils Relative % 77 %   Neutro Abs 5.2 1.7 - 7.7 K/uL   Lymphocytes Relative 17 %   Lymphs Abs 1.1 0.7 - 4.0 K/uL   Monocytes Relative 5 %   Monocytes Absolute 0.3 0.1 - 1.0 K/uL   Eosinophils Relative 1 %   Eosinophils Absolute 0.0 0.0 - 0.7 K/uL   Basophils Relative 0 %   Basophils Absolute 0.0 0.0 - 0.1 K/uL  I-stat troponin, ED     Status: None   Collection Time: 01/04/16  1:25 AM  Result Value Ref Range   Troponin i, poc 0.00 0.00 - 0.08 ng/mL   Comment 3            Comment: Due to the release kinetics of cTnI, a negative result within the first hours of the onset of symptoms does not rule out myocardial infarction with certainty. If myocardial infarction is still suspected, repeat the test at  appropriate intervals.   Comprehensive metabolic panel     Status: Abnormal   Collection Time: 01/04/16  2:17 AM  Result Value Ref Range   Sodium 139 135 - 145 mmol/L   Potassium 3.7 3.5 - 5.1 mmol/L   Chloride 114 (H) 101 - 111 mmol/L   CO2 19 (L) 22 - 32 mmol/L   Glucose, Bld 91 65 - 99 mg/dL   BUN 15 6 - 20 mg/dL   Creatinine, Ser 0.89 0.44 - 1.00 mg/dL   Calcium 6.8 (L) 8.9 - 10.3 mg/dL   Total Protein 4.6 (L) 6.5 - 8.1 g/dL   Albumin 2.5 (L) 3.5 - 5.0 g/dL   AST 14 (L) 15 - 41 U/L   ALT 6 (L) 14 - 54 U/L   Alkaline Phosphatase 49 38 - 126 U/L   Total Bilirubin 0.8 0.3 - 1.2 mg/dL   GFR calc non Af Amer 57 (L) >60 mL/min   GFR calc Af Amer >60 >60 mL/min    Comment: (NOTE) The eGFR has been calculated using the CKD EPI equation. This calculation has not been validated in all clinical situations. eGFR's persistently <60 mL/min signify possible Chronic Kidney Disease.    Anion gap 6 5 - 15  Lipase, blood     Status: None   Collection Time: 01/04/16  2:17 AM  Result Value Ref Range   Lipase 25 11 - 51 U/L  TSH     Status: None   Collection Time: 01/04/16  5:00 AM  Result Value Ref Range   TSH 1.498 0.350 - 4.500 uIU/mL    Comment: Performed by a 3rd Generation assay with a functional sensitivity of <=0.01 uIU/mL.  Troponin I (q 6hr x 3)     Status: None   Collection Time: 01/04/16  5:00 AM  Result Value Ref Range   Troponin I <0.03 <0.03 ng/mL   Dg Chest Portable 1 View  Result Date: 01/04/2016 CLINICAL DATA:  Recurrent midsternal chest pain beginning at 11 p.m. History of diabetes and hypertension. EXAM: PORTABLE CHEST 1 VIEW COMPARISON:  Chest radiograph May 31, 2015 FINDINGS: Cardiac silhouette is mildly enlarged. Mediastinal silhouette is nonsuspicious, mildly calcified aortic knob. No pleural effusion or focal consolidation. No pneumothorax. Broad dextroscoliosis may be in part positional as, more conspicuous than prior radiograph. Soft tissue planes are  nonsuspicious. Multiple skin folds. IMPRESSION: Mild cardiomegaly, no acute pulmonary process. Electronically Signed   By: Thana Farr.D.  On: 01/04/2016 02:32    ROS: General:no colds or fevers, no weight changes Skin:no rashes or ulcers HEENT:no blurred vision, no congestion CV:see HPI PUL:see HPI GI:no diarrhea constipation or melena, no indigestion GU:no hematuria, no dysuria MS:no joint pain, no claudication Neuro:no syncope, no lightheadedness Endo:+ diabetes, no thyroid disease   Blood pressure (!) 161/69, pulse 73, temperature 98 F (36.7 C), temperature source Oral, resp. rate 18, SpO2 99 %.  Wt Readings from Last 3 Encounters:  06/22/11 152 lb (68.9 kg)  05/06/11 157 lb 3.2 oz (71.3 kg)    PE: General:Pleasant affect, NAD Skin:Warm and dry, brisk capillary refill, cyst on Lt back HEENT:normocephalic, sclera clear, mucus membranes moist Neck:supple, no JVD, no bruits  Heart:S1S2 RRR with soft systolic murmur, no gallup, rub or click Lungs:clear without rales, rhonchi, or wheezes WNI:OEVO, non tender, + BS, do not palpate liver spleen or masses Ext:no lower ext edema, 2+ pedal pulses, 2+ radial pulses Neuro:alert and oriented X 3, MAE, follows commands, + facial symmetry TELE:  Short burst of SVT rate of 150.  Occurred this AM around 0650.   Assessment/Plan Principal Problem:   Chest pain, rule out acute myocardial infarction Active Problems:   HTN (hypertension)   DM2 (diabetes mellitus, type 2) (HCC)   Chest pain with multiple risk factors, HTN, DM, HLD, age.  troponins are neg but abnormal EKG  --she is NPO for possible nuc study Vs. Cath Dr. Johnsie Cancel to see.  Most likely nuc study.  SVT- rate 150 possible a flutter. Most episodes of her chest pain are preceded by rapid HR.   HTN - controlled, though elevated this AM  DM followed by IM  HLD- on Zocor   Cecilie Kicks  Nurse Practitioner Certified [place] Pager [phone]  or after 5pm or weekends call [phone] 01/04/2016, 7:04 AM    Patient examined chart reviewed. Delightful elderly [place] female with atypical chest pain. In setting of palpitations With telemetry showing some SVT. R/O no acute ECG changes short PR with no pre excitation. Start beta blocker Discussed options with patient and favor lexiscan myovue to risk stratify. Exam with split S1 clear lungs no AAA and  Good peripheral pulses  Jenkins Rouge

## 2016-01-04 NOTE — ED Triage Notes (Signed)
Pt arrived to Ed via [facility]. Reports of midsternal chest pain starting around 11pm last night.  States that chest pain is recurrent. EMS EKG showed prolonged QT interval. 324 aspirin and 1 Nitro provided by EMS. Pain relieved. No current CP. No N/V/D. Reports feeling jittery. No arm or chest pain at this time.

## 2016-01-04 NOTE — H&P (Addendum)
History and Physical    Diane Spanishafida Thor WGN:[id number] DOB: 05/21/1928 DOA: 01/04/2016   PCP: Lorenda PeckOBERTS, RONALD WAYNE, MD Chief Complaint:  Chief Complaint  Patient presents with  . Chest Pain    HPI: Diane Nolan is a 82 y.o. female with medical history significant of DM, HTN, HLD, smoking.  Patient presents to the ED with c/o sudden onset, moderate in severity, chest pain.  Symptoms onset earlier tonight.  Pain is aching in quality.  Given 324 ASA and 1 NTG en route to ED with relief.  Currently CP free.  ED Course: Trop negative, CXR neg, lab work unremarkable.  Review of Systems: As per HPI otherwise 10 point review of systems negative.    Past Medical History:  Diagnosis Date  . Diabetes mellitus   . HLD (hyperlipidemia)   . Hypertension   . Intracervical pessary    in place-dr henley manages    Past Surgical History:  Procedure Laterality Date  . CHILD BIRTH  X1  . CHOLECYSTECTOMY  06/01/2011   Procedure: LAPAROSCOPIC CHOLECYSTECTOMY WITH INTRAOPERATIVE CHOLANGIOGRAM;  Surgeon: Clovis Puhomas A. Cornett, MD;  Location: [facility];  Service: General;  Laterality: N/A;  Laparoscopic cholecystectomy with cholangiogram     reports that she has never smoked. She has never used smokeless tobacco. She reports that she does not drink alcohol or use drugs.  No Known Allergies  Family History  Problem Relation Age of Onset  . Other Mother     Old Age  . Diabetes Father       Prior to Admission medications   Medication Sig Start Date End Date Taking? Authorizing Provider  lisinopril (PRINIVIL,ZESTRIL) 5 MG tablet Take 5 mg by mouth daily. 11/24/15  Yes Historical Provider, MD  LORazepam (ATIVAN) 2 MG tablet Take 2 mg by mouth at bedtime.  12/29/15  Yes Historical Provider, MD  metFORMIN (GLUCOPHAGE) 500 MG tablet Take 500 mg by mouth daily with breakfast.    Yes Historical Provider, MD  metoprolol succinate (TOPROL-XL) 25 MG 24 hr tablet Take 25 mg by mouth daily.  11/20/15  Yes Historical Provider, MD  Multiple Vitamin (MULITIVITAMIN WITH MINERALS) TABS Take 1 tablet by mouth daily.   Yes Historical Provider, MD  simvastatin (ZOCOR) 20 MG tablet Take 20 mg by mouth every evening.   Yes Historical Provider, MD  levothyroxine (SYNTHROID, LEVOTHROID) 50 MCG tablet Take 50 mcg by mouth daily.    Historical Provider, MD    Physical Exam: Vitals:   01/04/16 0300 01/04/16 0330 01/04/16 0345 01/04/16 0445  BP: 125/77 135/73 135/73 137/69  Pulse: 69 71 75 67  Resp: 16 21 22 18   Temp:      TempSrc:      SpO2: 100% 100% 100% 100%      Constitutional: NAD, calm, comfortable Eyes: PERRL, lids and conjunctivae normal ENMT: Mucous membranes are moist. Posterior pharynx clear of any exudate or lesions.Normal dentition.  Neck: normal, supple, no masses, no thyromegaly Respiratory: clear to auscultation bilaterally, no wheezing, no crackles. Normal respiratory effort. No accessory muscle use.  Cardiovascular: Regular rate and rhythm, no murmurs / rubs / gallops. No extremity edema. 2+ pedal pulses. No carotid bruits.  Abdomen: no tenderness, no masses palpated. No hepatosplenomegaly. Bowel sounds positive.  Musculoskeletal: no clubbing / cyanosis. No joint deformity upper and lower extremities. Good ROM, no contractures. Normal muscle tone.  Skin: no rashes, lesions, ulcers. No induration Neurologic: CN 2-12 grossly intact. Sensation intact, DTR normal. Strength 5/5 in all 4.  Psychiatric: Normal judgment and insight. Alert and oriented x 3. Normal mood.    Labs on Admission: I have personally reviewed following labs and imaging studies  CBC:  Recent Labs Lab 01/04/16 0112  WBC 6.7  NEUTROABS 5.2  HGB 11.3*  HCT 33.8*  MCV 91.1  PLT 167   Basic Metabolic Panel:  Recent Labs Lab 01/04/16 0217  NA 139  K 3.7  CL 114*  CO2 19*  GLUCOSE 91  BUN 15  CREATININE 0.89  CALCIUM 6.8*   GFR: CrCl cannot be calculated (Unknown ideal  weight.). Liver Function Tests:  Recent Labs Lab 01/04/16 0217  AST 14*  ALT 6*  ALKPHOS 49  BILITOT 0.8  PROT 4.6*  ALBUMIN 2.5*    Recent Labs Lab 01/04/16 0217  LIPASE 25   No results for input(s): AMMONIA in the last 168 hours. Coagulation Profile: No results for input(s): INR, PROTIME in the last 168 hours. Cardiac Enzymes: No results for input(s): CKTOTAL, CKMB, CKMBINDEX, TROPONINI in the last 168 hours. BNP (last 3 results) No results for input(s): PROBNP in the last 8760 hours. HbA1C: No results for input(s): HGBA1C in the last 72 hours. CBG: No results for input(s): GLUCAP in the last 168 hours. Lipid Profile: No results for input(s): CHOL, HDL, LDLCALC, TRIG, CHOLHDL, LDLDIRECT in the last 72 hours. Thyroid Function Tests: No results for input(s): TSH, T4TOTAL, FREET4, T3FREE, THYROIDAB in the last 72 hours. Anemia Panel: No results for input(s): VITAMINB12, FOLATE, FERRITIN, TIBC, IRON, RETICCTPCT in the last 72 hours. Urine analysis:    Component Value Date/Time   COLORURINE YELLOW 04/29/2011 1132   APPEARANCEUR CLOUDY (A) 04/29/2011 1132   LABSPEC 1.010 04/29/2011 1132   PHURINE 6.5 04/29/2011 1132   GLUCOSEU NEGATIVE 04/29/2011 1132   HGBUR TRACE (A) 04/29/2011 1132   BILIRUBINUR NEGATIVE 04/29/2011 1132   KETONESUR NEGATIVE 04/29/2011 1132   PROTEINUR NEGATIVE 04/29/2011 1132   UROBILINOGEN 0.2 04/29/2011 1132   NITRITE POSITIVE (A) 04/29/2011 1132   LEUKOCYTESUR MODERATE (A) 04/29/2011 1132   Sepsis Labs: @LABRCNTIP (procalcitonin:4,lacticidven:4) )No results found for this or any previous visit (from the past 240 hour(s)).   Radiological Exams on Admission: Dg Chest Portable 1 View  Result Date: 01/04/2016 CLINICAL DATA:  Recurrent midsternal chest pain beginning at 11 p.m. History of diabetes and hypertension. EXAM: PORTABLE CHEST 1 VIEW COMPARISON:  Chest radiograph May 31, 2015 FINDINGS: Cardiac silhouette is mildly enlarged. Mediastinal  silhouette is nonsuspicious, mildly calcified aortic knob. No pleural effusion or focal consolidation. No pneumothorax. Broad dextroscoliosis may be in part positional as, more conspicuous than prior radiograph. Soft tissue planes are nonsuspicious. Multiple skin folds. IMPRESSION: Mild cardiomegaly, no acute pulmonary process. Electronically Signed   By: Awilda Metro M.D.   On: 01/04/2016 02:32    EKG: Independently reviewed.  Assessment/Plan Principal Problem:   Chest pain, rule out acute myocardial infarction Active Problems:   HTN (hypertension)   DM2 (diabetes mellitus, type 2) (HCC)    1. CP ruleout - 1. CP obs pathway 2. Serial trops 3. Tele monitor 4. NPO 5. Call cards in AM to decide if patient needs stress test 2. HTN - continue home meds 3. DM2 - holding metformin while NPO   DVT prophylaxis: Lovenox Code Status: Full Family Communication: Husband at bedside Consults called: None Admission status: Admit to obs   Craigory Toste, Heywood Iles DO [facility] Pager [phone] from 7PM-7AM  If 7AM-7PM, please contact the day physician for the patient [email or web address] Password [email or web address]  01/04/2016, 4:57 AM

## 2016-01-04 NOTE — Care Management Note (Signed)
Case Management Note  Patient Details  Name: Diane Nolan MRN: [id number] Date of Birth: 11/30/1928  Subjective/Objective:    Pt  presents with CP,history significant of DM, HTN, HLD, smoking. Resides with son, [name]. Pt states independent with ADL's and no DME usage. [place] from [place], speaks [place].   Nicole CellaDavid Recker (Son)    [phone]     PCP: Burton Apleyoberts, Ronald   Action/Plan: 01/03/2017, npo for nuclear study..... CM to f/u with disposition needs.   Expected Discharge Date:                  Expected Discharge Plan:  Home/Self Care  In-House Referral:     Discharge planning Services  CM Consult  Post Acute Care Choice:    Choice offered to:     DME Arranged:    DME Agency:     HH Arranged:    HH Agency:     Status of Service:  In process, will continue to follow  If discussed at Long Length of Stay Meetings, dates discussed:    Additional Comments:  Epifanio LeschesCole, Ardyce Heyer Hudson, RN 01/04/2016, 2:34 PM

## 2016-01-04 NOTE — ED Notes (Signed)
RN contacted lab, remaining labs are "being spun down" and will be resulted in the next 30 minutes.

## 2016-01-04 NOTE — ED Provider Notes (Signed)
[facility]-EMERGENCY DEPT Provider Note   CSN: [id number] Arrival date & time: 01/04/16  [id number] By signing my name below, I, Levon HedgerElizabeth Hall, attest that this documentation has been prepared under the direction and in the presence of Zadie Rhineonald Idalia Allbritton, MD . Electronically Signed: Levon HedgerElizabeth Hall, Scribe. 01/04/2016. 1:13 AM.   History   Chief Complaint Chief Complaint  Patient presents with  . Chest Pain    HPI Diane Nolan is a 82 y.o. female with a hx of DM, HLD and HTN who presents to the Emergency Department complaining of sudden onset, moderate chest pain onset tonight. She describes her pain as aching and states that this pain is recurrent. Pt was given 324 aspirin and 1 Nitro en route to the ED with some relief. No associated symptoms noted. She denies any diaphoresis, SOB, vomiting, dizziness, leg swelling, or back pain. No hx of stroke or heart attack.   The history is provided by the patient. No language interpreter was used.  Chest Pain   This is a recurrent problem. The current episode started 1 to 2 hours ago. Pertinent negatives include no abdominal pain, no back pain, no diaphoresis, no dizziness, no fever, no headaches, no leg pain, no lower extremity edema, no nausea, no shortness of breath and no vomiting. She has tried nitroglycerin for the symptoms. The treatment provided moderate relief. Risk factors include being elderly and post-menopausal.  Pertinent negatives for past medical history include no MI, no strokes and no TIA.    Past Medical History:  Diagnosis Date  . Diabetes mellitus   . HLD (hyperlipidemia)   . Hypertension   . Intracervical pessary    in place-dr henley manages    There are no active problems to display for this patient.   Past Surgical History:  Procedure Laterality Date  . CHILD BIRTH  X1  . CHOLECYSTECTOMY  06/01/2011   Procedure: LAPAROSCOPIC CHOLECYSTECTOMY WITH INTRAOPERATIVE CHOLANGIOGRAM;  Surgeon: Clovis Puhomas A. Cornett, MD;  Location: [facility]  [place];  Service: General;  Laterality: N/A;  Laparoscopic cholecystectomy with cholangiogram    OB History    No data available       Home Medications    Prior to Admission medications   Medication Sig Start Date End Date Taking? Authorizing Provider  atenolol (TENORMIN) 25 MG tablet Take 25 mg by mouth daily.    Historical Provider, MD  levothyroxine (SYNTHROID, LEVOTHROID) 50 MCG tablet Take 50 mcg by mouth daily.    Historical Provider, MD  metFORMIN (GLUCOPHAGE) 500 MG tablet Take 500 mg by mouth 2 (two) times daily with a meal.    Historical Provider, MD  Multiple Vitamin (MULITIVITAMIN WITH MINERALS) TABS Take 1 tablet by mouth daily.    Historical Provider, MD  simvastatin (ZOCOR) 20 MG tablet Take 20 mg by mouth every evening.    Historical Provider, MD    Family History Family History  Problem Relation Age of Onset  . Other Mother     Old Age  . Diabetes Father     Social History Social History  Substance Use Topics  . Smoking status: Never Smoker  . Smokeless tobacco: Never Used  . Alcohol use No     Allergies   Patient has no known allergies.   Review of Systems Review of Systems  Constitutional: Negative for diaphoresis and fever.  Respiratory: Negative for shortness of breath.   Cardiovascular: Positive for chest pain. Negative for leg swelling.  Gastrointestinal: Negative for abdominal pain, nausea and vomiting.  Musculoskeletal:  Negative for back pain.  Neurological: Negative for dizziness and headaches.  All other systems reviewed and are negative.    Physical Exam Updated Vital Signs BP 109/67   Pulse 79   Temp 98.3 F (36.8 C) (Oral)   Resp 22   SpO2 100%   Physical Exam CONSTITUTIONAL: Elderly and frail HEAD: Normocephalic/atraumatic EYES: EOMI/PERRL ENMT: Mucous membranes moist NECK: supple no meningeal signs SPINE/BACK:entire spine nontender CV: S1/S2 noted, no murmurs/rubs/gallops noted LUNGS: Lungs are clear to  auscultation bilaterally, no apparent distress ABDOMEN: soft, mild epigastric tenderness, no rebound or guarding, bowel sounds noted throughout abdomen GU:no cva tenderness NEURO: Pt is awake/alert/appropriate, moves all extremitiesx4.  No facial droop.   EXTREMITIES: pulses normal/equal, full ROM; no calf tenderness SKIN: warm, color normal PSYCH: no abnormalities of mood noted, alert and oriented to situation   ED Treatments / Results  DIAGNOSTIC STUDIES:  Oxygen Saturation is 99% on RA, normal by my interpretation.    COORDINATION OF CARE:  1:08 AM Will order lipase, CMP, I-stat troponin, and CBC. Discussed treatment plan with pt at bedside and pt agreed to plan.  Labs (all labs ordered are listed, but only abnormal results are displayed) Labs Reviewed  CBC WITH DIFFERENTIAL/PLATELET - Abnormal; Notable for the following:       Result Value   RBC 3.71 (*)    Hemoglobin 11.3 (*)    HCT 33.8 (*)    All other components within normal limits  COMPREHENSIVE METABOLIC PANEL - Abnormal; Notable for the following:    Chloride 114 (*)    CO2 19 (*)    Calcium 6.8 (*)    Total Protein 4.6 (*)    Albumin 2.5 (*)    AST 14 (*)    ALT 6 (*)    GFR calc non Af Amer 57 (*)    All other components within normal limits  LIPASE, BLOOD  TSH  TROPONIN I  TROPONIN I  TROPONIN I  I-STAT TROPOININ, ED    EKG  EKG Interpretation  Date/Time:  Monday January 04 2016 01:00:03 EST Ventricular Rate:  76 PR Interval:    QRS Duration: 71 QT Interval:  407 QTC Calculation: 458 R Axis:   32 Text Interpretation:  Sinus rhythm Probable left atrial enlargement Abnormal ekg Confirmed by Bebe ShaggyWICKLINE  MD, Laddie Naeem ([id number]) on 01/04/2016 1:02:50 AM       EKG Interpretation  Date/Time:  Monday January 04 2016 01:09:17 EST Ventricular Rate:  72 PR Interval:    QRS Duration: 73 QT Interval:  412 QTC Calculation: 451 R Axis:   32 Text Interpretation:  Sinus rhythm Probable left atrial  enlargement Minimal ST elevation, anterior leads Abnormal ekg No significant change since last tracing Confirmed by Bebe ShaggyWICKLINE  MD, Sahiba Granholm ([id number]) on 01/04/2016 1:17:17 AM       Radiology Dg Chest Portable 1 View  Result Date: 01/04/2016 CLINICAL DATA:  Recurrent midsternal chest pain beginning at 11 p.m. History of diabetes and hypertension. EXAM: PORTABLE CHEST 1 VIEW COMPARISON:  Chest radiograph May 31, 2015 FINDINGS: Cardiac silhouette is mildly enlarged. Mediastinal silhouette is nonsuspicious, mildly calcified aortic knob. No pleural effusion or focal consolidation. No pneumothorax. Broad dextroscoliosis may be in part positional as, more conspicuous than prior radiograph. Soft tissue planes are nonsuspicious. Multiple skin folds. IMPRESSION: Mild cardiomegaly, no acute pulmonary process. Electronically Signed   By: Awilda Metroourtnay  Bloomer M.D.   On: 01/04/2016 02:32    Procedures Procedures (including critical care time)  Medications Ordered in ED  Medications  nitroGLYCERIN (NITROSTAT) SL tablet 0.4 mg (not administered)  LORazepam (ATIVAN) tablet 2 mg (not administered)  lisinopril (PRINIVIL,ZESTRIL) tablet 5 mg (not administered)  metoprolol succinate (TOPROL-XL) 24 hr tablet 25 mg (not administered)  simvastatin (ZOCOR) tablet 20 mg (not administered)  acetaminophen (TYLENOL) tablet 650 mg (not administered)  ondansetron (ZOFRAN) injection 4 mg (not administered)  enoxaparin (LOVENOX) injection 40 mg (not administered)     Initial Impression / Assessment and Plan / ED Course  I have reviewed the triage vital signs and the nursing notes.  Pertinent labs & imaging results that were available during my care of the patient were reviewed by me and considered in my medical decision making (see chart for details).  Clinical Course     1:20 AM Pt seen after arrival Her EKG is abnormal, though does not meet STEMI criteria I have reviewed prior EKGs, as well as pre-hospital EKG and  several EKGs here in the ED.  No significant evoluation.  Her CP is improving Her workup is pending She has already received ASA/NTG 5:01 AM CP resolved Pt well appearing No acute EKG changes while in the ED Will admit for CP r/o ACS due to age/history Pt agreeable D/w dr gardner for admission  Final Clinical Impressions(s) / ED Diagnoses   Final diagnoses:  Chest pain, rule out acute myocardial infarction    New Prescriptions New Prescriptions   No medications on file  I personally performed the services described in this documentation, which was scribed in my presence. The recorded information has been reviewed and is accurate.        Zadie Rhine, MD 01/04/16 [phone]

## 2016-01-04 NOTE — Progress Notes (Signed)
Initial Nutrition Assessment  DOCUMENTATION CODES:   Severe malnutrition in context of chronic illness  INTERVENTION:    Continue Ensure Enlive PO BID, each supplement provides 350 kcal and 20 grams of protein; continue this at home after D/C.  NUTRITION DIAGNOSIS:   Malnutrition related to chronic illness as evidenced by severe depletion of muscle mass, severe depletion of body fat.  GOAL:   Patient will meet greater than or equal to 90% of their needs  MONITOR:   PO intake, Supplement acceptance, Labs  REASON FOR ASSESSMENT:   Malnutrition Screening Tool    ASSESSMENT:   82 y.o. female with PMH of DM, HTN, HLD, smoking.  Patient presents to the ED with c/o sudden onset, moderate in severity, chest pain.  Patient reports current weight ~112 lbs. She once weighed 160 lbs, but has progressively lost weight. She eats a small breakfast when she wakes up, then she has a home made dinner with her family. Nutrition-Focused physical exam completed. Findings are severe fat depletion, severe muscle depletion, and no edema.  Patient with severe PCM. Discussed the importance of adequate oral intake to promote weight maintenance and increase in lean body mass. She agreed to drink Ensure Enlive supplements BID now and after D/C home.  Diet Order:  Diet Heart Room service appropriate? Yes; Fluid consistency: Thin  Skin:  Reviewed, no issues  Last BM:  12/17  Height:   Ht Readings from Last 1 Encounters:  06/22/11 5' 0.5" (1.537 m)    Weight: 112.1 lbs per bed scale today.  Wt Readings from Last 1 Encounters:  06/22/11 152 lb (68.9 kg)    Ideal Body Weight:  46.6 kg  BMI:  21.5 (using bed scale weight and previously reported height)  Estimated Nutritional Needs:   Kcal:  1400-1600  Protein:  70-80 gm  Fluid:  >/= 1.5 L  EDUCATION NEEDS:   Education needs addressed  Joaquin CourtsKimberly Harris, RD, LDN, CNSC Pager [phone] After Hours Pager [phone]

## 2016-01-04 NOTE — ED Notes (Signed)
X-ray at bedside

## 2016-01-04 NOTE — ED Notes (Signed)
Delay in lab draw MD examining pt 

## 2016-01-05 DIAGNOSIS — E1100 Type 2 diabetes mellitus with hyperosmolarity without nonketotic hyperglycemic-hyperosmolar coma (NKHHC): Secondary | ICD-10-CM

## 2016-01-05 DIAGNOSIS — I10 Essential (primary) hypertension: Secondary | ICD-10-CM

## 2016-01-05 DIAGNOSIS — R079 Chest pain, unspecified: Secondary | ICD-10-CM | POA: Diagnosis not present

## 2016-01-05 NOTE — Discharge Instructions (Signed)
Heart-Healthy Eating Plan °Many factors influence your heart health, including eating and exercise habits. Heart (coronary) risk increases with abnormal blood fat (lipid) levels. Heart-healthy meal planning includes limiting unhealthy fats, increasing healthy fats, and making other small dietary changes. This includes maintaining a healthy body weight to help keep lipid levels within a normal range. °What is my plan? °Your health care provider recommends that you: °· Get no more than _________% of the total calories in your daily diet from fat. °· Limit your intake of saturated fat to less than _________% of your total calories each day. °· Limit the amount of cholesterol in your diet to less than _________ mg per day. ° °What types of fat should I choose? °· Choose healthy fats more often. Choose monounsaturated and polyunsaturated fats, such as olive oil and canola oil, flaxseeds, walnuts, almonds, and seeds. °· Eat more omega-3 fats. Good choices include salmon, mackerel, sardines, tuna, flaxseed oil, and ground flaxseeds. Aim to eat fish at least two times each week. °· Limit saturated fats. Saturated fats are primarily found in animal products, such as meats, butter, and cream. Plant sources of saturated fats include palm oil, palm kernel oil, and coconut oil. °· Avoid foods with partially hydrogenated oils in them. These contain trans fats. Examples of foods that contain trans fats are stick margarine, some tub margarines, cookies, crackers, and other baked goods. °What general guidelines do I need to follow? °· Check food labels carefully to identify foods with trans fats or high amounts of saturated fat. °· Fill one half of your plate with vegetables and green salads. Eat 4-5 servings of vegetables per day. A serving of vegetables equals 1 cup of raw leafy vegetables, ½ cup of raw or cooked cut-up vegetables, or ½ cup of vegetable juice. °· Fill one fourth of your plate with whole grains. Look for the word  "whole" as the first word in the ingredient list. °· Fill one fourth of your plate with lean protein foods. °· Eat 4-5 servings of fruit per day. A serving of fruit equals one medium whole fruit, ¼ cup of dried fruit, ½ cup of fresh, frozen, or canned fruit, or ½ cup of 100% fruit juice. °· Eat more foods that contain soluble fiber. Examples of foods that contain this type of fiber are apples, broccoli, carrots, beans, peas, and barley. Aim to get 20-30 g of fiber per day. °· Eat more home-cooked food and less restaurant, buffet, and fast food. °· Limit or avoid alcohol. °· Limit foods that are high in starch and sugar. °· Avoid fried foods. °· Cook foods by using methods other than frying. Baking, boiling, grilling, and broiling are all great options. Other fat-reducing suggestions include: °? Removing the skin from poultry. °? Removing all visible fats from meats. °? Skimming the fat off of stews, soups, and gravies before serving them. °? Steaming vegetables in water or broth. °· Lose weight if you are overweight. Losing just 5-10% of your initial body weight can help your overall health and prevent diseases such as diabetes and heart disease. °· Increase your consumption of nuts, legumes, and seeds to 4-5 servings per week. One serving of dried beans or legumes equals ½ cup after being cooked, one serving of nuts equals 1½ ounces, and one serving of seeds equals ½ ounce or 1 tablespoon. °· You may need to monitor your salt (sodium) intake, especially if you have high blood pressure. Talk with your health care provider or dietitian to get   more information about reducing sodium. °What foods can I eat? °Grains ° °Breads, including French, white, pita, wheat, raisin, rye, oatmeal, and Italian. Tortillas that are neither fried nor made with lard or trans fat. Low-fat rolls, including hotdog and hamburger buns and English muffins. Biscuits. Muffins. Waffles. Pancakes. Light popcorn. Whole-grain cereals. Flatbread.  Melba toast. Pretzels. Breadsticks. Rusks. Low-fat snacks and crackers, including oyster, saltine, matzo, graham, animal, and rye. Rice and pasta, including brown rice and those that are made with whole wheat. °Vegetables °All vegetables. °Fruits °All fruits, but limit coconut. °Meats and Other Protein Sources °Lean, well-trimmed beef, veal, pork, and lamb. Chicken and turkey without skin. All fish and shellfish. Wild duck, rabbit, pheasant, and venison. Egg whites or low-cholesterol egg substitutes. Dried beans, peas, lentils, and tofu. Seeds and most nuts. °Dairy °Low-fat or nonfat cheeses, including ricotta, string, and mozzarella. Skim or 1% milk that is liquid, powdered, or evaporated. Buttermilk that is made with low-fat milk. Nonfat or low-fat yogurt. °Beverages °Mineral water. Diet carbonated beverages. °Sweets and Desserts °Sherbets and fruit ices. Honey, jam, marmalade, jelly, and syrups. Meringues and gelatins. Pure sugar candy, such as hard candy, jelly beans, gumdrops, mints, marshmallows, and small amounts of dark chocolate. Angel food cake. °Eat all sweets and desserts in moderation. °Fats and Oils °Nonhydrogenated (trans-free) margarines. Vegetable oils, including soybean, sesame, sunflower, olive, peanut, safflower, corn, canola, and cottonseed. Salad dressings or mayonnaise that are made with a vegetable oil. Limit added fats and oils that you use for cooking, baking, salads, and as spreads. °Other °Cocoa powder. Coffee and tea. All seasonings and condiments. °The items listed above may not be a complete list of recommended foods or beverages. Contact your dietitian for more options. °What foods are not recommended? °Grains °Breads that are made with saturated or trans fats, oils, or whole milk. Croissants. Butter rolls. Cheese breads. Sweet rolls. Donuts. Buttered popcorn. Chow mein noodles. High-fat crackers, such as cheese or butter crackers. °Meats and Other Protein Sources °Fatty meats, such  as hotdogs, short ribs, sausage, spareribs, bacon, ribeye roast or steak, and mutton. High-fat deli meats, such as salami and bologna. Caviar. Domestic duck and goose. Organ meats, such as kidney, liver, sweetbreads, brains, gizzard, chitterlings, and heart. °Dairy °Cream, sour cream, cream cheese, and creamed cottage cheese. Whole milk cheeses, including blue (bleu), Monterey Jack, Brie, [place], American, Havarti, Swiss, cheddar, Camembert, and [place]. Whole or 2% milk that is liquid, evaporated, or condensed. Whole buttermilk. Cream sauce or high-fat cheese sauce. Yogurt that is made from whole milk. °Beverages °Regular sodas and drinks with added sugar. °Sweets and Desserts °Frosting. Pudding. Cookies. Cakes other than angel food cake. Candy that has milk chocolate or white chocolate, hydrogenated fat, butter, coconut, or unknown ingredients. Buttered syrups. Full-fat ice cream or ice cream drinks. °Fats and Oils °Gravy that has suet, meat fat, or shortening. Cocoa butter, hydrogenated oils, palm oil, coconut oil, palm kernel oil. These can often be found in baked products, candy, fried foods, nondairy creamers, and whipped toppings. Solid fats and shortenings, including bacon fat, salt pork, lard, and butter. Nondairy cream substitutes, such as coffee creamers and sour cream substitutes. Salad dressings that are made of unknown oils, cheese, or sour cream. °The items listed above may not be a complete list of foods and beverages to avoid. Contact your dietitian for more information. °This information is not intended to replace advice given to you by your health care provider. Make sure you discuss any questions you have with your health care   provider. °Document Released: 10/13/2007 Document Revised: 07/24/2015 Document Reviewed: 06/27/2013 °[employer] Interactive Patient Education © 2017 [employer] ° °

## 2016-01-05 NOTE — Discharge Summary (Signed)
Physician Discharge Summary  Diane Nolan MRN: [id number] DOB/AGE: 1928/12/23 82 y.o.  PCP: Myriam Jacobson, MD   Admit date: 01/04/2016 Discharge date: 01/05/2016  Discharge Diagnoses:    Principal Problem:   Chest pain, rule out acute myocardial infarction Active Problems:   HTN (hypertension)   DM2 (diabetes mellitus, type 2) ([place])    Follow-up recommendations Follow-up with PCP in 3-5 days , including all  additional recommended appointments as below Follow-up CBC, CMP in 3-5 days       Current Discharge Medication List    CONTINUE these medications which have NOT CHANGED   Details  lisinopril (PRINIVIL,ZESTRIL) 5 MG tablet Take 5 mg by mouth daily.    LORazepam (ATIVAN) 2 MG tablet Take 2 mg by mouth at bedtime.     metFORMIN (GLUCOPHAGE) 500 MG tablet Take 500 mg by mouth daily with breakfast.     metoprolol succinate (TOPROL-XL) 25 MG 24 hr tablet Take 25 mg by mouth daily.    Multiple Vitamin (MULITIVITAMIN WITH MINERALS) TABS Take 1 tablet by mouth daily.    simvastatin (ZOCOR) 20 MG tablet Take 20 mg by mouth every evening.    levothyroxine (SYNTHROID, LEVOTHROID) 50 MCG tablet Take 50 mcg by mouth daily.         Discharge Condition:  Stable   Discharge Instructions Get Medicines reviewed and adjusted: Please take all your medications with you for your next visit with your Primary MD  Please request your Primary MD to go over all hospital tests and procedure/radiological results at the follow up, please ask your Primary MD to get all Hospital records sent to his/her office.  If you experience worsening of your admission symptoms, develop shortness of breath, life threatening emergency, suicidal or homicidal thoughts you must seek medical attention immediately by calling 911 or calling your MD immediately if symptoms less severe.  You must read complete instructions/literature along with all the possible adverse reactions/side effects for  all the Medicines you take and that have been prescribed to you. Take any new Medicines after you have completely understood and accpet all the possible adverse reactions/side effects.   Do not drive when taking Pain medications.   Do not take more than prescribed Pain, Sleep and Anxiety Medications  Special Instructions: If you have smoked or chewed Tobacco in the last 2 yrs please stop smoking, stop any regular Alcohol and or any Recreational drug use.  Wear Seat belts while driving.  Please note  You were cared for by a hospitalist during your hospital stay. Once you are discharged, your primary care physician will handle any further medical issues. Please note that NO REFILLS for any discharge medications will be authorized once you are discharged, as it is imperative that you return to your primary care physician (or establish a relationship with a primary care physician if you do not have one) for your aftercare needs so that they can reassess your need for medications and monitor your lab values.     No Known Allergies    Disposition: 01-Home or Self Care   Consults: Cardiology    Significant Diagnostic Studies:  Nm Myocar Multi W/spect W/wall Motion / Ef  Result Date: 01/04/2016  There was no ST segment deviation noted during stress.  No T wave inversion was noted during stress.  Defect 1: There is a small defect of moderate severity.  This is a low risk study.  Nuclear stress EF: 77%.  Small size, moderate intensity fixed basal inferolateral defect  likely attenuation artifact. No significant reversible ischemia. LVEF 77% with normal wall motion. This is a low risk study.   Dg Chest Portable 1 View  Result Date: 01/04/2016 CLINICAL DATA:  Recurrent midsternal chest pain beginning at 11 p.m. History of diabetes and hypertension. EXAM: PORTABLE CHEST 1 VIEW COMPARISON:  Chest radiograph May 31, 2015 FINDINGS: Cardiac silhouette is mildly enlarged. Mediastinal  silhouette is nonsuspicious, mildly calcified aortic knob. No pleural effusion or focal consolidation. No pneumothorax. Broad dextroscoliosis may be in part positional as, more conspicuous than prior radiograph. Soft tissue planes are nonsuspicious. Multiple skin folds. IMPRESSION: Mild cardiomegaly, no acute pulmonary process. Electronically Signed   By: Elon Alas M.D.   On: 01/04/2016 02:32     Study Result     There was no ST segment deviation noted during stress.  No T wave inversion was noted during stress.  Defect 1: There is a small defect of moderate severity.  This is a low risk study.  Nuclear stress EF: 77%.   Small size, moderate intensity fixed basal inferolateral defect likely attenuation artifact. No significant reversible ischemia. LVEF 77% with normal wall motion. This is a low risk study.        Labs: Results for orders placed or performed during the hospital encounter of 01/04/16 (from the past 48 hour(s))  CBC with Differential/Platelet     Status: Abnormal   Collection Time: 01/04/16  1:12 AM  Result Value Ref Range   WBC 6.7 4.0 - 10.5 K/uL   RBC 3.71 (L) 3.87 - 5.11 MIL/uL   Hemoglobin 11.3 (L) 12.0 - 15.0 g/dL   HCT 33.8 (L) 36.0 - 46.0 %   MCV 91.1 78.0 - 100.0 fL   MCH 30.5 26.0 - 34.0 pg   MCHC 33.4 30.0 - 36.0 g/dL   RDW 14.5 11.5 - 15.5 %   Platelets 167 150 - 400 K/uL   Neutrophils Relative % 77 %   Neutro Abs 5.2 1.7 - 7.7 K/uL   Lymphocytes Relative 17 %   Lymphs Abs 1.1 0.7 - 4.0 K/uL   Monocytes Relative 5 %   Monocytes Absolute 0.3 0.1 - 1.0 K/uL   Eosinophils Relative 1 %   Eosinophils Absolute 0.0 0.0 - 0.7 K/uL   Basophils Relative 0 %   Basophils Absolute 0.0 0.0 - 0.1 K/uL  I-stat troponin, ED     Status: None   Collection Time: 01/04/16  1:25 AM  Result Value Ref Range   Troponin i, poc 0.00 0.00 - 0.08 ng/mL   Comment 3            Comment: Due to the release kinetics of cTnI, a negative result within the first  hours of the onset of symptoms does not rule out myocardial infarction with certainty. If myocardial infarction is still suspected, repeat the test at appropriate intervals.   Comprehensive metabolic panel     Status: Abnormal   Collection Time: 01/04/16  2:17 AM  Result Value Ref Range   Sodium 139 135 - 145 mmol/L   Potassium 3.7 3.5 - 5.1 mmol/L   Chloride 114 (H) 101 - 111 mmol/L   CO2 19 (L) 22 - 32 mmol/L   Glucose, Bld 91 65 - 99 mg/dL   BUN 15 6 - 20 mg/dL   Creatinine, Ser 0.89 0.44 - 1.00 mg/dL   Calcium 6.8 (L) 8.9 - 10.3 mg/dL   Total Protein 4.6 (L) 6.5 - 8.1 g/dL   Albumin 2.5 (L) 3.5 -  5.0 g/dL   AST 14 (L) 15 - 41 U/L   ALT 6 (L) 14 - 54 U/L   Alkaline Phosphatase 49 38 - 126 U/L   Total Bilirubin 0.8 0.3 - 1.2 mg/dL   GFR calc non Af Amer 57 (L) >60 mL/min   GFR calc Af Amer >60 >60 mL/min    Comment: (NOTE) The eGFR has been calculated using the CKD EPI equation. This calculation has not been validated in all clinical situations. eGFR's persistently <60 mL/min signify possible Chronic Kidney Disease.    Anion gap 6 5 - 15  Lipase, blood     Status: None   Collection Time: 01/04/16  2:17 AM  Result Value Ref Range   Lipase 25 11 - 51 U/L  TSH     Status: None   Collection Time: 01/04/16  5:00 AM  Result Value Ref Range   TSH 1.498 0.350 - 4.500 uIU/mL    Comment: Performed by a 3rd Generation assay with a functional sensitivity of <=0.01 uIU/mL.  Troponin I (q 6hr x 3)     Status: None   Collection Time: 01/04/16  5:00 AM  Result Value Ref Range   Troponin I <0.03 <0.03 ng/mL  Troponin I (q 6hr x 3)     Status: None   Collection Time: 01/04/16 11:33 AM  Result Value Ref Range   Troponin I <0.03 <0.03 ng/mL  D-dimer, quantitative (not at [facility])     Status: None   Collection Time: 01/04/16 12:50 PM  Result Value Ref Range   D-Dimer, Quant 0.46 0.00 - 0.50 ug/mL-FEU    Comment: (NOTE) At the manufacturer cut-off of 0.50 ug/mL FEU, this assay has  been documented to exclude PE with a sensitivity and negative predictive value of 97 to 99%.  At this time, this assay has not been approved by the FDA to exclude DVT/VTE. Results should be correlated with clinical presentation.   Troponin I (q 6hr x 3)     Status: None   Collection Time: 01/04/16  4:28 PM  Result Value Ref Range   Troponin I <0.03 <0.03 ng/mL      HPI :   50 yof admitted early AM hours today with chest pain that began around 10 pm to 11PM. ASA and NTG sl mostly with relief.  She has had similar chest pain, usually occurring at rest and she has tachycardia first.   She called 911 last night because it was more intense than usual.  She had no associated symptoms of nausea, SOB or diaphoresis. She has a hx of DM, HLD and HTN, smoking.   No prior hx of CAD no hx CVA  EKG with mild ST elevation in V2-3, troponins are neg. 0.00 and < 0.03.  Cr. Stable at 0.89, TSH 1.498, CXR mild cardiomegaly.     HOSPITAL COURSE:    Chest painwith multiple risk factors, HTN, DM, HLD, age. troponins are neg but abnormal EKG --s/p nuc study , fixed defect but low risk study , discussed with Dr. Johnsie Cancel, no further workup recommended   D-dimer negative   SVT-transient, now normal sinus rhythm, continue metoprolol  HTN - controin the 120s prior to discharge   DM stable ,  Accu-Chek stable  HLD- on Zocor  Discharge Exam:   Blood pressure (!) 121/57, pulse 77, temperature 98.3 F (36.8 C), temperature source Oral, resp. rate 14, weight 49.9 kg (110 lb 1 oz), SpO2 96 %.  Heart:S1S2 RRR with soft systolic murmur,  no gallup, rub or click Lungs:clear without rales, rhonchi, or wheezes LKJ:ZPHX, non tender, + BS, do not palpate liver spleen or masses Ext:no lower ext edema, 2+ pedal pulses, 2+ radial pulses Neuro:alert and oriented X 3, MAE, follows commands, + facial symmetry    Follow-up Information    ROBERTS, Sharol Given, MD. Schedule an appointment as soon as possible for a  visit.   Specialty:  Internal Medicine Why:  hospital follow up Contact information: [address], [place] [address] [phone]           Signed: Reyne Dumas 01/05/2016, 9:54 AM        Time spent >45 mins

## 2017-08-21 ENCOUNTER — Ambulatory Visit (HOSPITAL_COMMUNITY)
Admission: EM | Admit: 2017-08-21 | Discharge: 2017-08-21 | Disposition: A | Discharge status: Home / Self Care | Payer: Medicare Other

## 2017-08-21 ENCOUNTER — Encounter (HOSPITAL_COMMUNITY): Payer: Self-pay | Admitting: Emergency Medicine

## 2017-08-21 ENCOUNTER — Ambulatory Visit (INDEPENDENT_AMBULATORY_CARE_PROVIDER_SITE_OTHER): Payer: Medicare Other

## 2017-08-21 DIAGNOSIS — M25562 Pain in left knee: Secondary | ICD-10-CM

## 2017-08-21 DIAGNOSIS — W19XXXA Unspecified fall, initial encounter: Secondary | ICD-10-CM

## 2017-08-21 DIAGNOSIS — S82002A Unspecified fracture of left patella, initial encounter for closed fracture: Secondary | ICD-10-CM

## 2017-08-21 DIAGNOSIS — M25462 Effusion, left knee: Secondary | ICD-10-CM | POA: Diagnosis not present

## 2017-08-21 MED ORDER — LORAZEPAM 1 MG PO TABS: 1.0000 mg | ORAL_TABLET | Freq: Every day | ORAL | 0 refills | Status: DC

## 2017-08-21 NOTE — Discharge Instructions (Addendum)
Please contact any of the following orthopedic practices to set up an appointment for a consult for a possible knee fracture: [facility]  You may take 500mg  Tylenol every 6 hours for pain and inflammation. I cannot prescribe additional pain medication that contains hydrocodone, oxycodone or tramadol due to use of lorazepam and therefore you need to manage the pain with just Tylenol.  Please make sure that you follow-up with your PCP for continued refills of your lorazepam.  I prescribed the 1 mg dose as opposed to the usual 2 mg that you take due to the increased risk for falls that this medication can have.

## 2017-08-21 NOTE — ED Triage Notes (Signed)
Pt states she fell while cleaning the kitchen and hit her face and knees, injury happened 1.5 weeks ago. Pt states her face is fine but her L knee has still been bothering her and shes been needing to use a cane. Pt also requesting refill on medications.

## 2017-08-21 NOTE — ED Provider Notes (Signed)
MRN: [id number] DOB: 06/02/1928  Subjective:   Diane Nolan is a 84 y.o. female presenting for 1.5-week history of persistent left knee pain and swelling status post falling while cleaning the kitchen.  Patient states that she broke her fall by falling directly onto the left knee, also made contact with her hands and face.  Today, she denies any face pain, hand pain but her knee pain has persisted and is making it very difficult for her to move her knee including bending and extending.  She has decreased mobility as it is, uses a cane.  She is also out of her lorazepam, takes 2 mg at bedtime.  Her PCP is out of town for this next week and she is requesting a refill.  No current facility-administered medications for this encounter.   Current Outpatient Medications:  .  isosorbide mononitrate (IMDUR) 30 MG 24 hr tablet, Take 30 mg by mouth daily., Disp: , Rfl:  .  lisinopril (PRINIVIL,ZESTRIL) 5 MG tablet, Take 5 mg by mouth daily., Disp: , Rfl:  .  LORazepam (ATIVAN) 2 MG tablet, Take 2 mg by mouth at bedtime. , Disp: , Rfl:  .  metoprolol succinate (TOPROL-XL) 25 MG 24 hr tablet, Take 25 mg by mouth daily., Disp: , Rfl:  .  simvastatin (ZOCOR) 20 MG tablet, Take 20 mg by mouth every evening., Disp: , Rfl:  .  levothyroxine (SYNTHROID, LEVOTHROID) 50 MCG tablet, Take 50 mcg by mouth daily., Disp: , Rfl:  .  metFORMIN (GLUCOPHAGE) 500 MG tablet, Take 500 mg by mouth daily with breakfast. , Disp: , Rfl:  .  Multiple Vitamin (MULITIVITAMIN WITH MINERALS) TABS, Take 1 tablet by mouth daily., Disp: , Rfl:    No Known Allergies  Past Medical History:  Diagnosis Date  . Diabetes mellitus   . HLD (hyperlipidemia)   . Hypertension   . Intracervical pessary    in place-dr henley manages     Past Surgical History:  Procedure Laterality Date  . CHILD BIRTH  X1  . CHOLECYSTECTOMY  06/01/2011   Procedure: LAPAROSCOPIC CHOLECYSTECTOMY WITH INTRAOPERATIVE CHOLANGIOGRAM;  Surgeon: Clovis Puhomas A. Cornett,  MD;  Location:  SURGERY CENTER;  Service: General;  Laterality: N/A;  Laparoscopic cholecystectomy with cholangiogram    Objective:   Vitals: BP 128/76 (BP Location: Left Arm)   Pulse 74   Temp 98.2 F (36.8 C) (Oral)   Resp 16   SpO2 97%   Physical Exam  Constitutional: She is oriented to person, place, and time. She appears well-developed and well-nourished.  Cardiovascular: Normal rate.  Pulmonary/Chest: Effort normal.  Musculoskeletal:       Left knee: She exhibits decreased range of motion (full extension due to pain) and swelling. She exhibits no effusion, no ecchymosis, no deformity, no laceration, no erythema, normal alignment, normal patellar mobility, no bony tenderness and normal meniscus. Tenderness (directly over patella) found. No medial joint line, no lateral joint line and no patellar tendon tenderness noted.  Neurological: She is alert and oriented to person, place, and time.   Dg Knee Complete 4 Views Left  Result Date: 08/21/2017 CLINICAL DATA:  Left knee pain after fall a week ago. EXAM: LEFT KNEE - COMPLETE 4+ VIEW COMPARISON:  None. FINDINGS: Slightly depressed appearance of the medial tibial plateau. Small joint effusion. Moderate medial compartment joint space narrowing. Chondrocalcinosis of the menisci. Osteopenia. Questionable small calcified loose bodies in the medial suprapatellar joint space. IMPRESSION: 1. Slightly depressed appearance of the medial tibial plateau may be related  to underlying osteoarthritis, but given the recent history of trauma and small joint effusion, a depressed fracture is not excluded. Recommend CT for further evaluation. Electronically Signed   By: Obie Dredge M.D.   On: 08/21/2017 10:54    Assessment and Plan :   Acute pain of left knee  Pain and swelling of left knee  Fall, initial encounter  We will place patient in a leg immobilizer due to possible fracture of her left knee.  She is to schedule Tylenol for her  pain and inflammation.  She will set up an appointment with her orthopedist for further imaging and consult on possible left patellar fracture.  I refilled her lorazepam at 1 mg daily given that her PCP is unavailable and she has run out of the medicine completely.  Counseled against regular use of this particular medication as it can pose a risk for falls, patient will discuss this with her PCP.   Wallis Bamberg, [place] 08/21/17 1610

## 2017-08-30 ENCOUNTER — Other Ambulatory Visit: Payer: Self-pay | Admitting: Orthopedic Surgery

## 2017-08-30 DIAGNOSIS — M25562 Pain in left knee: Secondary | ICD-10-CM

## 2017-09-11 ENCOUNTER — Ambulatory Visit
Admission: RE | Admit: 2017-09-11 | Discharge: 2017-09-11 | Disposition: A | Discharge status: Home / Self Care | Payer: Medicare Other | Source: Ambulatory Visit | Attending: Orthopedic Surgery | Admitting: Orthopedic Surgery

## 2017-09-11 DIAGNOSIS — M25562 Pain in left knee: Secondary | ICD-10-CM

## 2019-02-08 IMAGING — CT CT KNEE*L* W/O CM
1 series · 12 of 14 positions shown, 15 images · non-contrast
Comparison: Plain films left knee 08/21/2017.

CLINICAL DATA: The patient suffered a fall in late July 2017 while
cleaning her kitchen resulting in a left knee injury. Continued
pain. Initial encounter.

EXAM:
CT OF THE LEFT KNEE WITHOUT CONTRAST
TECHNIQUE: Multidetector CT imaging of the left knee was performed according to
the standard protocol. Multiplanar CT image reconstructions were
also generated.

[Series 4: knee soft tissue · axial · 0.37mm/px · z∈[-242,-76]mm · 12 of 66 slices shown, 15 images]
[im 6/66  soft-tissue]
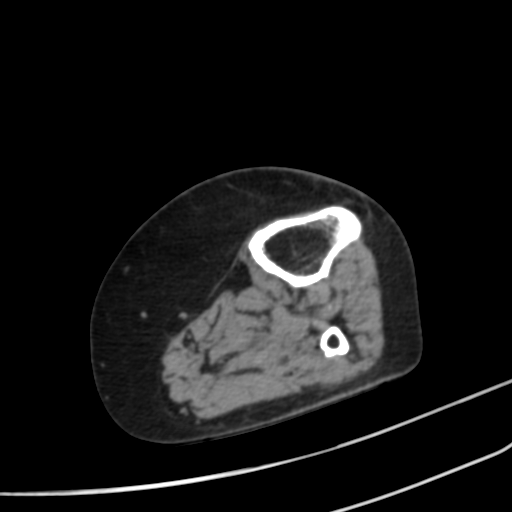
[im 6/66  bone]
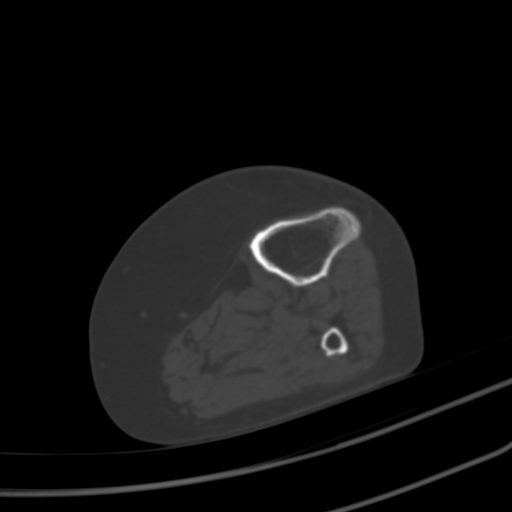
[im 11/66  bone]
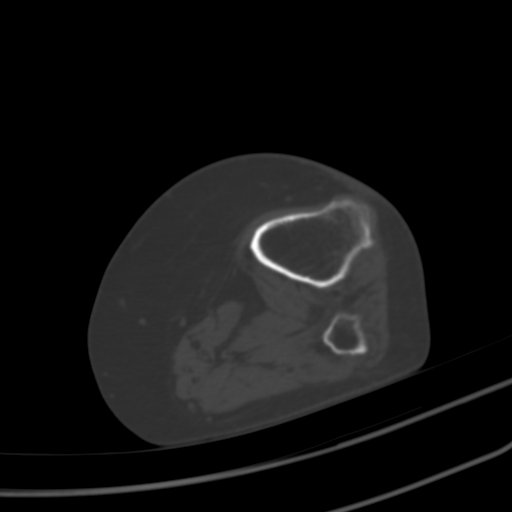
[im 16/66  bone]
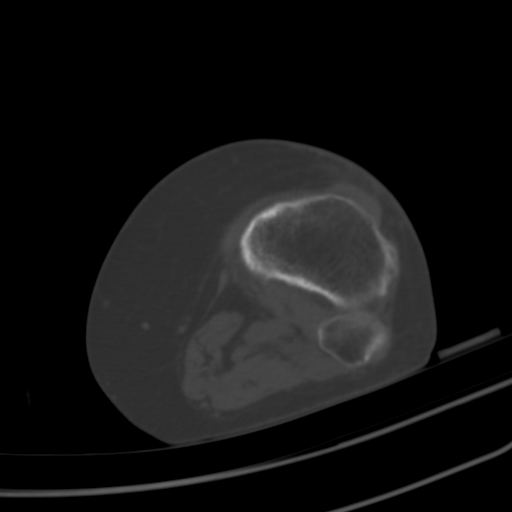
[im 21/66  bone]
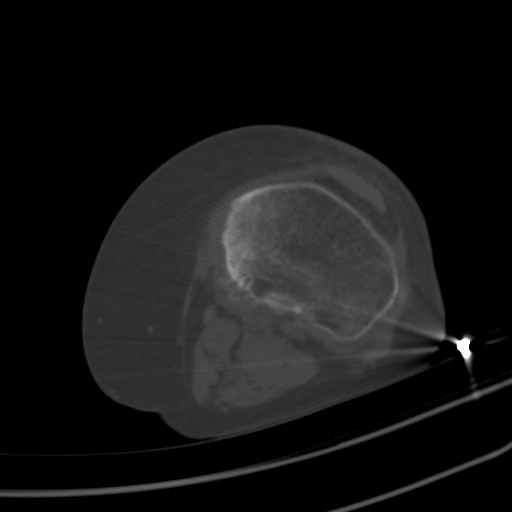
[im 26/66  soft-tissue]
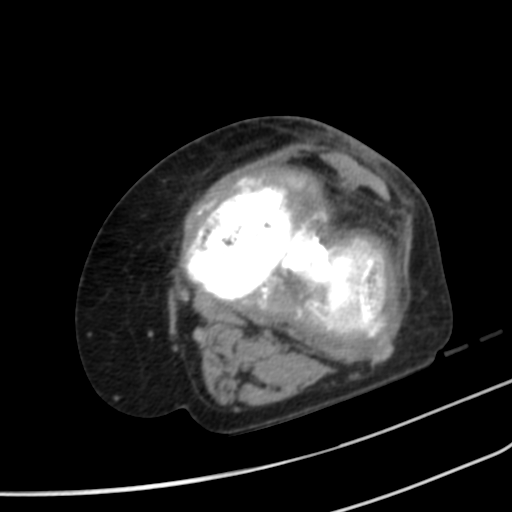
[im 26/66  bone]
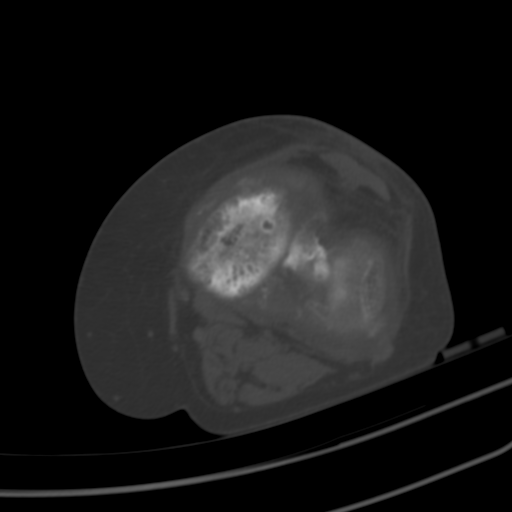
[im 31/66  bone]
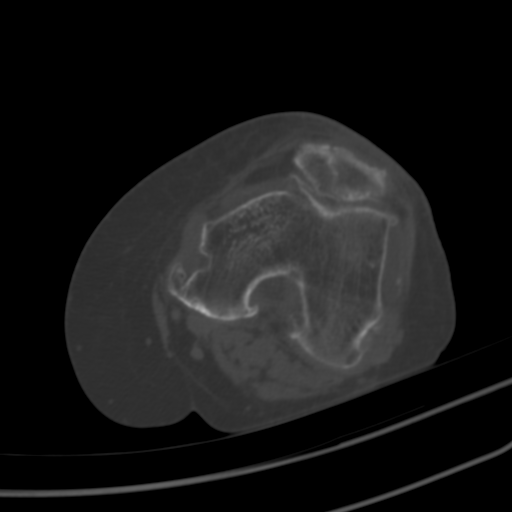
[im 36/66  bone]
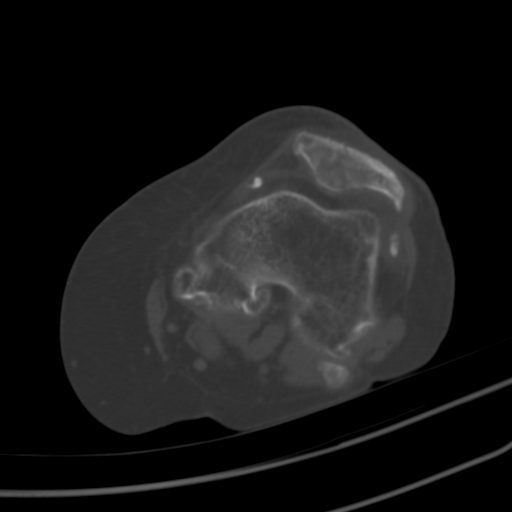
[im 41/66  bone]
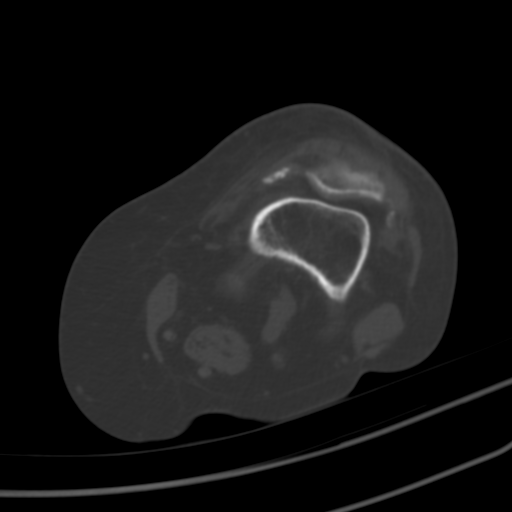
[im 46/66  soft-tissue]
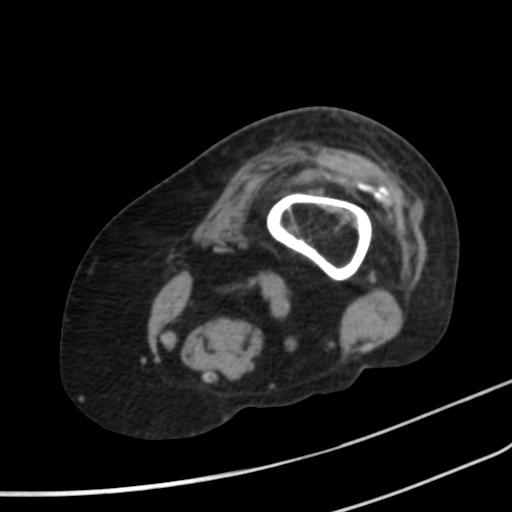
[im 46/66  bone]
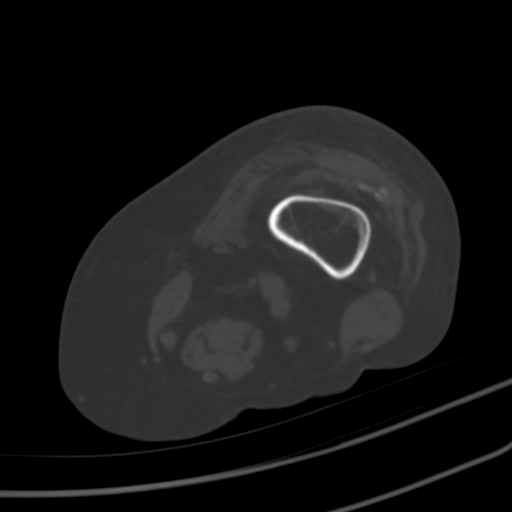
[im 51/66  bone]
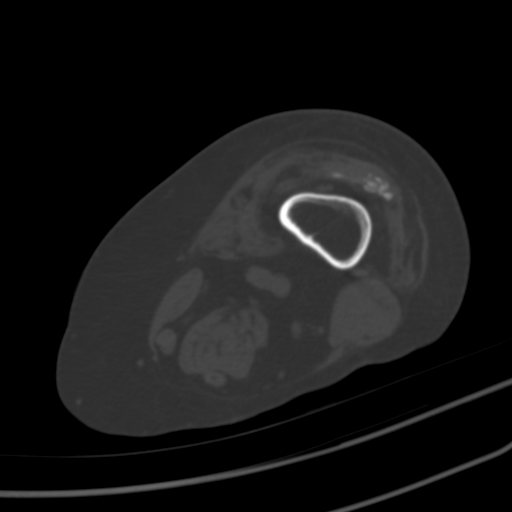
[im 56/66  bone]
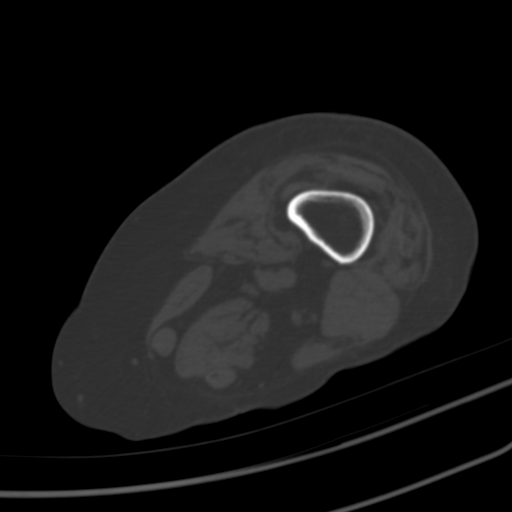
[im 61/66  bone]
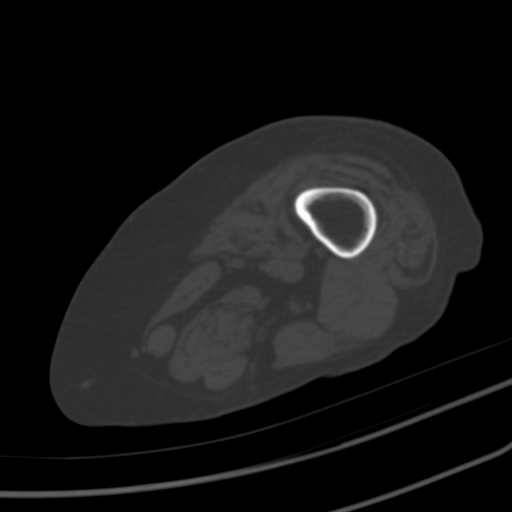

[12 of 14 positions shown; findings below may reference images not displayed]

FINDINGS: Bones/Joint/Cartilage

The patient has a mildly comminuted but nondisplaced fracture of the
patella. The main fracture line is transverse in orientation through
approximately the mid pole with 2-3 fracture lines extending
superiorly toward the patellar apex. No other fracture is
identified.

Osteoarthritis about the knee is most severe in the medial and
patellofemoral compartments. Bulky osteophytosis is seen about all 3
compartments. Chondrocalcinosis is present about the joint.

Ligaments

Suboptimally assessed by CT.

Muscles and Tendons

Intact.

Soft tissues

No fluid collection or mass.
IMPRESSION: Nondisplaced, mildly comminuted patellar fracture. No other acute
bony abnormality is identified.

Advanced osteoarthritis about the knee is most severe in the medial
compartment.

Chondrocalcinosis.

## 2019-07-12 ENCOUNTER — Other Ambulatory Visit: Payer: Self-pay

## 2019-07-12 NOTE — Patient Outreach (Signed)
Aging Gracefully Program  07/12/2019  Diane Nolan Dec 31, 1928 [id number]   [facility] Evaluation Interviewer attempted to call patient on today regarding Aging Gracefully referral. Spoke with patient's son, Onalee Hua who is patient's POA. Son requested to call me back to schedule initial interview.  Will attempt to call back within 1 week if I do not hear back from [place].   [facility] Management Assistant [phone]

## 2019-07-15 ENCOUNTER — Other Ambulatory Visit: Payer: Self-pay

## 2019-07-15 NOTE — Patient Outreach (Signed)
Aging Gracefully Program  07/15/2019  Diane Nolan [birth year] [id number]  [facility] Evaluation Interviewer made contact with patient's son Onalee Hua. Aging Gracefully survey scheduled for tomorrow 07/16/19 @ 2:00 pm with Domingo Cocking, CMA.   [facility] Management Assistant [phone]

## 2019-07-16 ENCOUNTER — Other Ambulatory Visit: Payer: Self-pay

## 2019-07-16 NOTE — Patient Outreach (Signed)
Aging Gracefully Program  07/16/2019  Conor Filsaime 1928-01-21 [id number]  [facility] Evaluation Interviewer made contact with patient. Aging Gracefully survey completed.   Interviewer will send referral to Rowe Pavy, RN and OT for follow up.   [facility] Management Assistant

## 2019-08-05 ENCOUNTER — Other Ambulatory Visit: Payer: Self-pay

## 2019-08-05 ENCOUNTER — Other Ambulatory Visit: Payer: Self-pay | Admitting: Occupational Therapy

## 2019-08-05 NOTE — Patient Outreach (Signed)
Aging Gracefully Program  OT Initial Visit  08/05/2019  Diane Nolan 10/17/1928 [id number]  Visit:  1- Initial Visit  Start Time:  1000 End Time:  1055 Total Minutes:  55  CCAP: Typical Daily Routine: Typical Daily Routine:: Pt reports daily routine as mostly spent in the home. She states that she works around the inside of the home doing housework. She does not drive, so when she leaves the house she needs to be drive by family members. She reports occasionally visiting her neighbor/friend as well. Most of her time is spent reading/writing scriptures from the bible. What Types Of Care Problems Are You Having Throughout The Day?: Pt reports having knee pain that will occasionally limit her mobility and engagement with what she would like to do. What Kind Of Help Do You Receive?: Pt has help from family members, mostly Son- Diane Nolan whom was present for evaluation. [name] assist with finances, transportation, organizing medical appointments, and mobility (stairs into his home with his mother comes over), etc. Do You Think You Need Other Types Of Help?: No What Do You Think Would Make Everyday Life Easier For You?: Home modifications and tips to be safer aging in place in the home What Is A Good Day Like?: Going out to visit a friend, doing housework and cleaning What Is A Bad Day Like?: Knee pain that limits any activity Do You Have Time For Yourself?: Yes, most of pt time is for herself per her report Patient Reported Equipment: Patient Reported Equipment Currently Used: Agricultural consultantolling Walker (reports only using it outside of the home) Functional Mobility-Walking Indoors/Getting Around the House:   Functional Mobility-Walk A Block: Walk A Block: Unable To Do Importance Of Learning New Strategies:: Not At All Other Comments:: cannot do at baseline, not an area of desire improvement Functional Mobility-Maintain Balance While Showering: Maintaining Balance While Showering: Moderate Difficulty Do  You:: Use A Device (pt reports she has to hang onto the wall while standing) Importance Of Learning New Strategies:: Very Much Observation: Maintain Balance While Showering: Supervision Safety: Moderate/Extreme Risk Efficiency: Not At All Intervention: Yes Other Comments:: Discussed shower seat and walk in shower. Pt has difficulty clearing ledge of tub then maintaining standing balance safely when washing body Functional Mobility-Stooping, Crouching, Kneeling To Retreive Item: Stooping, Crouching, or Kneeling To Retrieve Item: Moderate Difficulty Do You:: No Device/No Assistance Importance Of Learning New Strategies:: A Little Observation: Stoop, Crouch, or Kneel: Minimal Assistance Safety: Moderate/Extreme Risk Efficiency: Somewhat Intervention: Yes Other Comments:: discussed use of reacher to help pick up dropped items to [employer] safety Functional Mobility-Bending From Standing Position To Pick Up Clothing Off The Floor: Bending Over From Standing Position To Pick Up Clothing Off The Floor: Moderate Difficulty Do You:: No Device/No Assistance Importance Of Learning New Strategies:: A Little Observation: Bending Over From Standing Postion To Pick Up Clothing Off Of Floor: Minimal Assistance Safety: Moderate/Extreme Risk Efficiency: Not At All Intervention: Yes Other Comments:: again discussed use of reacher Functional Mobility-Reaching For Items Above Shoulder Level: Reaching For Items Above Shoulder Level: No Difficulty Do You:: No Device/No Assistance Importance Of Learning New Strategies:: Not At All Functional Mobility-Climb 1 Flight Of Stairs: Climb 1 Flight Of Stairs: Unable To Do Do You:: Use Personal Assistance Importance Of Learning New Strategies:: Not At All Other Comments:: Pt cannot climb full flight of stairs. She has 5-6 stairs at her sons home and when she visits she utilizes hand rail on one side and son on the other to maintain safety. Observation:  Climb One  Flight Of Stairs: N/O Safety: A Little Risk Efficiency: Very Intervention: No Functional Mobility-Move In And Out Of Chair: Move In and Out Of A Chair: A Little Difficulty Do You:: No Device/No Assistance Importance Of Learning New Strategies:: A Little Other Comments:: discussed difficulty with lower surfaces, pt has strategy of where to sit in home if needed Functional Mobility-Move In And Out Of Bed: Move In and Out Of Bed: No Difficulty Do You:: No Device/No Assistance Importance Of Learning New Strategies:: Not At All Functional Mobility-Move In And Out Of Bath/Shower: Move In And Out Of A Bath/Shower: Moderate Difficulty Do You:: No Device/No Assistance (hangs onto wall per pt report) Importance Of Learning New Strategies:: Very Much Observation: Move In And Out Of Bath/Shower: Minimal Assistance Safety: Extreme Risk Efficiency: Not At All Intervention: Yes Other Comments:: Discussed walk in shower with seat and grab bars to maintain safety Functional Mobility-Get On And Off Toilet: Getting Up From The Floor: A Lot Of Difficulty Do You:: Use Both A Device And Personal Assistance Importance Of Learning New Strategies:: Very Much Observation: Getting Up From The Floor: N/O Other Comments:: pt would like to practice next session Functional Mobility-Into And Out Of Car, Not Including Driving: Into  And Out Of Car, Not Including Driving: Moderate Difficulty Do You:: Use Personal Assistance Importance Of Learning New Strategies:: Moderate Observation: Into And Out Of Car, Not Including Driving: N/O Other Comments:: Son reports needing to help pt in and out of car for successful t/f Functional Mobility-Other Mobility Difficulty: Other Mobility Identified:: Pt furniture walks around home, reaching for far out objects. There are uneven floors and throw rugs posing a fall risk. Mobility Other Difficulty: A Little Difficulty Do You:: No Device/No Assistance Importance Of Learning New  Strategies:: A Little Other Comments:: Pt resistant to using RW around home Observation: Other Mobility Identified: Supervision Safety: Moderate/Extreme Risk Efficiency: Not At All Intervention: Yes Other Comments:: Educated pt on safe mobility without furniture walking    Activities of Daily Living-Bathing/Showering: ADL-Bathing/Showering: A Little Difficulty Do You:: No Device/No Assistance Importance Of Learning New Strategies: Very Much ADL Observation: Bathing/Showering: N/O Activities of Daily Living-Personal Hygiene and Grooming: Personal Hygiene and Grooming: No Difficulty Do You:: No Device/No Assistance Importance Of Learning New Strategies: Not At All Activities of Daily Living-Toilet Hygiene: Toilet Hygiene: No Difficulty Do You:: No Device/No Assistance Importance Of Learning New Strategies: Not At All Activities of Daily Living-Put On And Take Off Undergarments (Incl. Fasteners): Put On And Take Off Undergarments (Incl. Fasteners): No Difficulty Do You:: No Device/No Assistance Importance Of Learning New Strategies: Not At All Activities of Daily Living-Put On And Take Off Shirt/Dress/Coat (Incl. Fasteners): Put On And Take Off Shirt/Dress/Coat (Incl. Fasteners): No Difficulty Do You:: No Device/No Assistance Importance Of Learning New Strategies: Not At All Other Comments:: often avoids more complex fasteners Activities of Daily Living-Put On And Take Off Socks And Shoes: Put On And Take Off Socks And  Shoes: No Difficulty Do You:: No Device/No Assistance Activities of Daily Living-Feed Self: Feed Self: No Difficulty Do You:: No Device/No Assistance Importance Of Learning New Strategies: Not At All Activities of Daily Living-Rest And Sleep: Rest and Sleep: No Difficulty Do You:: No Device/No Assistance Importance Of Learning New Strategies: Not At All Activities of Daily Living-Sexual Activity: Sexual  Activity: N/A Activities of Daily Living-Other Activity  Identified: Other Activiy Identified: N/A  Instrumental Activities of Daily Living-Light Homemaking (Laundry, Straightening Up, Vacuuming):  Do Light Homemaking (Laundry, Straightening  Up, Vacuuming): Moderate Difficulty Do You:: No Device/No Assistance Importance Of Learning New Strategies: A Little Other Comments:: Pt does not currently do laundry due to plumbing issue. She does straighten up/clean up around the home, but is unsafe with this due to unsteady balance. Although pt does state her way is efficient/ Instrumental Activities of Daily Living-Making A Bed: Making a Bed: A Little Difficulty Do You:: No Device/No Assistance Importance Of Learning New Strategies: Not At All Instrumental Activities of Daily Living-Washing Dishes By Hand While Standing At The Sink: Washing Dishes By Hand While Standing At The Sink: Moderate Difficulty Do You:: No Device/No Assistance Importance Of Learning New Strategies: A Little Other Comments:: Pt reports knee pain with increased standing IADL Observation: Washing Dishes By Hand While Standing At The Sink: N/O Safety: A Little Risk Efficiency: Somewhat Other Comments:: Discussed possibility of stool in kitchen for safe standing tolerance Instrumental Activities of Daily Living-Grocery Shopping: Do Grocery Shopping: N/A Other Comments:: Son completes this for pt Instrumental Activities of Daily Living-Use Telephone: Use Telephone: No Difficulty Do You:: No Device/No Assistance Importance Of Learning New Strategies: Not At All Instrumental Activities of Daily Living-Financial Management: Financial Management: N/A (son completes for pt at baseline) Instrumental Activities of Daily Living-Medications: Take Medications: A Little Difficulty Do You:: No Device/No Assistance Importance Of Learning New Strategies: Not At All Other Comments:: Son reports pt does have "trouble" staying consistent with medications and that he has tried a pill organizer  but pt was not recepetive. Pt reports her method is sufficient. Will inform RN of this as well for education, for pt did state she did not know what her pills are for. Instrumental Activities of Daily Living-Health Management And Maintenance: Health Management & Maintenance: No Difficulty Do You:: Use Personal Assistance Importance Of Learning New Strategies: Not At All Other Comments:: Son assists with scheduling and transportation of necessary medical appointments Instrumental Activities of Daily Living-Meal Preparation and Clean-Up: Meal Preparation and Clean-Up: A Little Difficulty Do You:: No Device/No Assistance Importance Of Learning New Strategies: A Little IADL Observation: Meal Preparation And Clean Up: N/O Instrumental Activities of Daily Living-Provide Care For Others/Pets: Care For Others/Pets: N/A Instrumental Activities of Daily Living-Take Part In Organized Social Activities: Take Part In Organized Social Activities: N/A Other Comments:: Pt has chosen to not go out of home to church as she usually would. She remains social with her neighbor friend Instrumental Activities of Daily Living-Leisure Participation: Leisure Participation: A Little Difficulty Do You:: Use Personal Assistance Importance Of Learning New Strategies: Not At All Other Comments:: relies on son for transportation Instrumental Activities of Daily Living-Employment/Volunteer Activities: Employment/Volunteer Activities: N/A Instrumental Activities of Daily Living-Other Identifies:    Readiness To Change Score:  Readiness to Change Score: 5.67  Home Environment Assessment: Outside Home Entry:: Pt with 1, long flat STE. May benefit from rail to enter/exit safely. Dining Room:: Cluttered with large table, little ability to spaciously move through entryway Living Room:: large throw rug in center of living room Kitchen:: uneven and slick flooring posing fall risk Bathroom:: tub shower, low commodes.  Difficulty entering/exiting shower safely, difficulty with low commode Other Home Environment Concerns:: plumbing/hot water issues- reports [facility] is aware of this issue  Durable Medical Equipment:    Patient Education: Education Provided: Yes Education Details: educated pt and son on aging gracefuly OT session and purpose; discussed next visit expectations Person(s) Educated: Patient, Child(ren) (son, Diane Hua) Comprehension: Verbalized Understanding  Goals: Goals Addressed  This Visit's Progress   . Patient Stated       Improve safety and independence with showering    . Patient Stated       Increase education and skills in fall reduction/safe falling techniques    . Patient Stated       Improve independence getting on/off toilet       Post Clinical Reasoning: Clinician View Of Client Situation:: Pt appears to be thankful of services, but hesitant to change ways. Son is very encouraging and wanting mother to take suggestions/safer options. Pt is very quick to state "everything is fine" but will reveal areas of difficulty with further discussion/education Client View Of His/Her Situation:: Pt feels she is starting to have difficulties with basic BADL/IADL tasks Next Visit Plan:: Review DME/AE, safe falling    Dalphine Handing, MSOT, OTR/L Acute Rehabilitation Services, Aging Gracefully OT [facility] Office Number: [phone] Pager: [phone]

## 2019-08-20 ENCOUNTER — Other Ambulatory Visit: Payer: Self-pay

## 2019-08-20 NOTE — Patient Outreach (Signed)
Aging Gracefully Program  08/20/2019  Diane Nolan [birth year] [id number]   Aging Gracefully:  Placed call to son to schedule home visit. Son states he is at [employer] right now signing papers. States he will call me with his schedule for next week.  PLAN: will await a call back. If no response will call back in 1 week.  Rowe Pavy, RN, BSN, CEN [facility] [employer] [phone]

## 2019-08-24 ENCOUNTER — Other Ambulatory Visit: Payer: Self-pay

## 2019-08-24 NOTE — Patient Outreach (Signed)
Aging Gracefully Program  08/24/2019  Diane Nolan 1928/07/07 [id number]   Care Coordination:  Return call from son and home visit planned for 08/28/2019 at 1:30 pm  Son will be present.  Rowe Pavy, RN, BSN, CEN [facility] [employer] [phone]

## 2019-09-10 ENCOUNTER — Other Ambulatory Visit: Payer: Self-pay

## 2019-09-12 NOTE — Patient Outreach (Signed)
Aging Gracefully Program  RN Visit  09/12/2019  Diane Nolan 84-20-1930 [id number]  Visit:   RN home visit #1  ( aready had [facility] calendar or passport) (provided my contact card,  Start Time:   1100 End Time:   1215 Total Minutes:   75 minutes  Readiness To Change Score:  Readiness to Change Score: 6  Universal RN Interventions: Calendar Distribution: Yes Exercise Review: No Medications: Yes Medication Changes: Yes Mood: Yes Pain: Yes PCP Advocacy/Support: No Fall Prevention: Yes Incontinence: Yes Clinician View Of Client Situation: Patient sitting on couch. Son present during home visit.  Patient speaks English but looks at son to translate. Son states : mom you understand Engish. Answer the lady.  Paitent is able to repsond appropriately. Home neat and clean.  Patient ambulates by hlding on to furniture and walls as she walks through the house. Client View Of His/Her Situation: Patient reports she has had 2 falls in the last 2 years. Reports she is afraid of falling.  Reports she has knee pain and takes tylenol for it. reports she has a neighbor that comes to pick her up 2-3 times per week and takes patient to her home for dinner and conversation.  Healthcare Provider Communication: Did Surveyor, mining With [employer] Provider?: Yes Method Of Communication: Hotel manager of Client Situation: Diplomatic Services operational officer Of Client Situation: Patient sitting on couch. Son present during home visit.  Patient speaks English but looks at son to translate. Son states : mom you understand Engish. Answer the lady.  Paitent is able to repsond appropriately. Home neat and clean.  Patient ambulates by hlding on to furniture and walls as she walks through the house. Client's View of His/Her Situation: Client View Of His/Her Situation: Patient reports she has had 2 falls in the last 2 years. Reports she is afraid of falling.  Reports she has knee pain and takes tylenol for it. reports she has  a neighbor that comes to pick her up 2-3 times per week and takes patient to her home for dinner and conversation.  Medication Assessment: Do You Have Any Problems Paying For Medications?: No Where Does Client Store Medications?: Other: (bedroom night stand) Can Client Read Pill Bottles?: Yes Does Client Use A Pillbox?: No Does Anyone Assist Client In Taking Medications?: No Do You Take Vitamin D?: No Total Number Of Medications That The Client Takes: 3 Does Client Have Any Questions Or Concerns About Medictions?: No Is Client Complaining Of Any Symptoms That Could Be Side Effects To Medications?: No Any Possible Changes In Medication Regimen?: No   Outpatient Encounter Medications as of 09/10/2019  Medication Sig Note  . acetaminophen (TYLENOL) 650 MG CR tablet Take 650 mg by mouth every 8 (eight) hours as needed for pain. Normally takes 2 tablets once a day for knee pain.   Marland Kitchen LORazepam (ATIVAN) 1 MG tablet Take 1 tablet (1 mg total) by mouth at bedtime. (Patient taking differently: Take 2 mg by mouth at bedtime. )   . metoprolol succinate (TOPROL-XL) 25 MG 24 hr tablet Take 25 mg by mouth daily.   . simvastatin (ZOCOR) 20 MG tablet Take 20 mg by mouth every evening.   . [DISCONTINUED] isosorbide mononitrate (IMDUR) 30 MG 24 hr tablet Take 30 mg by mouth daily. (Patient not taking: Reported on 09/10/2019)   . [DISCONTINUED] levothyroxine (SYNTHROID, LEVOTHROID) 50 MCG tablet Take 50 mcg by mouth daily. (Patient not taking: Reported on 09/10/2019) 01/04/2016: Per the RPh @ [facility], this  hasn't been filled since 2016  . [DISCONTINUED] lisinopril (PRINIVIL,ZESTRIL) 5 MG tablet Take 5 mg by mouth daily. (Patient not taking: Reported on 09/10/2019)   . [DISCONTINUED] metFORMIN (GLUCOPHAGE) 500 MG tablet Take 500 mg by mouth daily with breakfast.  (Patient not taking: Reported on 09/10/2019) 01/04/2016: NOT the "ER" (verified with the RPh @ [facility])  . [DISCONTINUED] Multiple Vitamin  (MULITIVITAMIN WITH MINERALS) TABS Take 1 tablet by mouth daily. (Patient not taking: Reported on 09/10/2019)    No facility-administered encounter medications on file as of 09/10/2019.    Session Summary: Pleasant 86 year old with a strong faith and family support. Knee pain and fear of falling noted. Reviewed safety measures.    Goals Addressed            This Visit's Progress   . Patient Stated       Patient states she will work on safety in the home by taking her cell phone will her at all times.   Rowe Pavy, RN, BSN, CEN [facility] Community Care Coordinator [phone]   Interventions: reviewed with patient and son the importance of safety at all times.  Reviewed use of being able to call for help  in case of a fall or emergency. Reviewed importance of safety and fall prevention.  Reviewed fear of falling. Encouraged patient to use her walker outside. Reviewed unlevel concrete.   Rowe Pavy, RN, BSN, CEN [facility] [phone]       Next home visit scheduled with patient and son for 10/02/2019  Rowe Pavy, RN, BSN, CEN [facility] Coordinator [phone]

## 2019-09-30 ENCOUNTER — Encounter: Payer: Self-pay | Admitting: Occupational Therapy

## 2019-10-02 ENCOUNTER — Other Ambulatory Visit: Payer: Self-pay

## 2019-10-04 NOTE — Patient Outreach (Signed)
Aging Gracefully Program  RN Visit  10/04/2019  Diane Nolan [birth year] [id number]  Visit:   RN visit #2  Start Time:   1500 End Time:   1545 Total Minutes:   45  Readiness To Change Score:     Universal RN Interventions: Calendar Distribution: Yes Exercise Review: Yes Medications: Yes Medication Changes: Yes Pain: Yes PCP Advocacy/Support: Yes Fall Prevention: Yes Incontinence: Yes Clinician View Of Client Situation: Arrived and patient had forgotten about appointment. Son not present due to new job.  Paitent reports she continues to have pain in her leg and knees with walking. No pain when sitting.  Holds onto walls and furniture in the home. Client View Of His/Her Situation: Patient reports continued pain.  Reports not being very active and resting alot.  Healthcare Provider Communication:    Diplomatic Services operational officer of Client Situation: Clinician View Of Client Situation: Arrived and patient had forgotten about appointment. Son not present due to new job.  Paitent reports she continues to have pain in her leg and knees with walking. No pain when sitting.  Holds onto walls and furniture in the home. Client's View of His/Her Situation: Client View Of His/Her Situation: Patient reports continued pain.  Reports not being very active and resting alot.  Medication Assessment:    OT Update: No changes in medications.   Session Summary: Reviewed hand rails at entrance. Reviewed with patient AG exercises and how to complete. Reviewed pain and difficulty ambulating with pain. Encouraged patient to do exercises to help with pain in legs.   Goals    . Patient Stated     Improve safety and independence with showering    . Patient Stated     Increase education and skills in fall reduction/safe falling techniques    . Patient Stated     Improve independence getting on/off toilet    . Patient Stated     Patient states she will work on safety in the home by taking her cell phone will  her at all times.   Rowe Pavy, RN, BSN, CEN [facility] Community Care Coordinator [phone]   Interventions: reviewed with patient and son the importance of safety at all times.  Reviewed use of being able to call for help  in case of a fall or emergency. Reviewed importance of safety and fall prevention.  Reviewed fear of falling. Encouraged patient to use her walker outside. Reviewed unlevel concrete.   Rowe Pavy, RN, BSN, CEN [facility] Community Care Coordinator [phone]   10/02/2019  Reviewed safety and need for special attention and importance of having ability to contact someone if she falls. Reviewed AG exercises.  Demonstrated with return demonstration from patient about how to do exercises. Some exercises not safe for patient like side leg raises.  Encouraged patient to complete exercises daily.   Rowe Pavy, RN, BSN, CEN [facility] [phone]       Next home visit planned for October 6 at 12:00  Rowe Pavy, RN, BSN, [facility] [employer] [phone]

## 2019-10-23 ENCOUNTER — Other Ambulatory Visit: Payer: Self-pay

## 2019-10-24 NOTE — Patient Outreach (Signed)
Aging Gracefully Program  RN Visit  10/24/2019  Sheala Dosh 05/18/1928 [id number]  Visit:   RN home visit #4  Start Time:   1230 End Time:   1330 Total Minutes:   60  Readiness To Change Score:     Universal RN Interventions: Calendar Distribution: Yes Exercise Review: Yes Medications: Yes Medication Changes: Yes Mood: Yes Pain: Yes PCP Advocacy/Support: Yes Fall Prevention: Yes Incontinence: Yes Clinician View Of Client Situation: Arrived for home visit and patient reports she is doing well. Reports her legs still hurt with ambulation but when sitting still they dont hurt. Denies any new problems. Noted to be not using walker in the home and holding onto coffee table and walls to walk. Client View Of His/Her Situation: Reports leg pain with movement. Reports doing exercises occasionally.  Healthcare Provider Communication: Did Higher education careers adviser With [employer] Provider?: Yes Method Of Communication: Telephone Healthcare Provider Response According to RN: Review with Dr. Janeice Robinson about patients continued knee pain with ambulation. Suggested OTC Osteobiflex and MD agreed. Son notifed and will pick up for paitent.  Clinician View of Client Situation: Clinician View Of Client Situation: Arrived for home visit and patient reports she is doing well. Reports her legs still hurt with ambulation but when sitting still they dont hurt. Denies any new problems. Noted to be not using walker in the home and holding onto coffee table and walls to walk. Client's View of His/Her Situation: Client View Of His/Her Situation: Reports leg pain with movement. Reports doing exercises occasionally.  Medication Assessment:denies any changes in medications. Reports taking medications as prescribed.     OT Update: email to OT to inform she may visit without sons presence.   Session Summary:Nursing goals met. Patient continues to have knee pain with ambulation. I spoke with MD and he agreed for  patient to take osteo biflex OTC. Son informed and will pick up med for patient. Reviewed with son it might take 7-10 days for patient to notice a difference in her joints.    PLAN: case closed to nursing. OT remains active. Bathroom modification to start next week.   Tomasa Rand, RN, BSN, CEN [facility] [employer] [phone]

## 2020-04-23 ENCOUNTER — Other Ambulatory Visit: Payer: Self-pay | Admitting: Occupational Therapy

## 2020-04-27 ENCOUNTER — Encounter: Payer: Self-pay | Admitting: Occupational Therapy

## 2020-04-27 NOTE — Patient Outreach (Addendum)
Aging Gracefully Program  OT FINAL Visit  04/27/2020  Diane Nolan 13-Jul-1928 [id number]  OT Discharge: OT has attempted to contact pt and/or son on multiple occasions, unable to reach pt and no phone calls have been returned. CHS modifications have been completed, functioning and independence with ADLs is unknown at this time. Goals are incomplete. OT will discharge pt from the Aging Gracefully program.        Readiness to Change:   unknown  Home Environment Assessment: unable to complete post CHS modifcations    Durable Medical Equipment: None provided    Patient Education: provided at eval, no education since due to no contact    Goals: Goals Addressed   None     Ezra Sites, OTR/L  [phone]

## 2020-07-13 ENCOUNTER — Emergency Department (HOSPITAL_COMMUNITY): Payer: Medicare Other

## 2020-07-13 ENCOUNTER — Other Ambulatory Visit: Payer: Self-pay

## 2020-07-13 ENCOUNTER — Inpatient Hospital Stay (HOSPITAL_COMMUNITY): Payer: Medicare Other

## 2020-07-13 ENCOUNTER — Inpatient Hospital Stay (HOSPITAL_COMMUNITY)
Admission: EM | Admit: 2020-07-13 | Discharge: 2020-07-20 | DRG: 551 | Disposition: A | Discharge status: Skilled Nursing Facility | Payer: Medicare Other | Attending: Internal Medicine | Admitting: Internal Medicine

## 2020-07-13 ENCOUNTER — Encounter (HOSPITAL_COMMUNITY): Payer: Self-pay | Admitting: Emergency Medicine

## 2020-07-13 DIAGNOSIS — R441 Visual hallucinations: Secondary | ICD-10-CM | POA: Diagnosis not present

## 2020-07-13 DIAGNOSIS — E871 Hypo-osmolality and hyponatremia: Secondary | ICD-10-CM | POA: Diagnosis not present

## 2020-07-13 DIAGNOSIS — M170 Bilateral primary osteoarthritis of knee: Secondary | ICD-10-CM | POA: Diagnosis present

## 2020-07-13 DIAGNOSIS — S12100A Unspecified displaced fracture of second cervical vertebra, initial encounter for closed fracture: Secondary | ICD-10-CM

## 2020-07-13 DIAGNOSIS — W19XXXA Unspecified fall, initial encounter: Secondary | ICD-10-CM

## 2020-07-13 DIAGNOSIS — S12111A Posterior displaced Type II dens fracture, initial encounter for closed fracture: Secondary | ICD-10-CM | POA: Diagnosis present

## 2020-07-13 DIAGNOSIS — W1830XA Fall on same level, unspecified, initial encounter: Secondary | ICD-10-CM | POA: Diagnosis present

## 2020-07-13 DIAGNOSIS — E1122 Type 2 diabetes mellitus with diabetic chronic kidney disease: Secondary | ICD-10-CM | POA: Diagnosis present

## 2020-07-13 DIAGNOSIS — E785 Hyperlipidemia, unspecified: Secondary | ICD-10-CM | POA: Diagnosis present

## 2020-07-13 DIAGNOSIS — F419 Anxiety disorder, unspecified: Secondary | ICD-10-CM | POA: Diagnosis present

## 2020-07-13 DIAGNOSIS — N179 Acute kidney failure, unspecified: Secondary | ICD-10-CM | POA: Diagnosis present

## 2020-07-13 DIAGNOSIS — B373 Candidiasis of vulva and vagina: Secondary | ICD-10-CM | POA: Diagnosis present

## 2020-07-13 DIAGNOSIS — S12111D Posterior displaced Type II dens fracture, subsequent encounter for fracture with routine healing: Secondary | ICD-10-CM | POA: Diagnosis not present

## 2020-07-13 DIAGNOSIS — N136 Pyonephrosis: Secondary | ICD-10-CM | POA: Diagnosis not present

## 2020-07-13 DIAGNOSIS — E538 Deficiency of other specified B group vitamins: Secondary | ICD-10-CM | POA: Diagnosis present

## 2020-07-13 DIAGNOSIS — N1832 Chronic kidney disease, stage 3b: Secondary | ICD-10-CM | POA: Diagnosis present

## 2020-07-13 DIAGNOSIS — N289 Disorder of kidney and ureter, unspecified: Secondary | ICD-10-CM | POA: Diagnosis not present

## 2020-07-13 DIAGNOSIS — Z79899 Other long term (current) drug therapy: Secondary | ICD-10-CM

## 2020-07-13 DIAGNOSIS — G9341 Metabolic encephalopathy: Secondary | ICD-10-CM | POA: Diagnosis present

## 2020-07-13 DIAGNOSIS — E860 Dehydration: Secondary | ICD-10-CM | POA: Diagnosis present

## 2020-07-13 DIAGNOSIS — Z20822 Contact with and (suspected) exposure to covid-19: Secondary | ICD-10-CM | POA: Diagnosis present

## 2020-07-13 DIAGNOSIS — B9620 Unspecified Escherichia coli [E. coli] as the cause of diseases classified elsewhere: Secondary | ICD-10-CM | POA: Diagnosis not present

## 2020-07-13 DIAGNOSIS — I700 Atherosclerosis of aorta: Secondary | ICD-10-CM | POA: Diagnosis present

## 2020-07-13 DIAGNOSIS — Z833 Family history of diabetes mellitus: Secondary | ICD-10-CM | POA: Diagnosis not present

## 2020-07-13 DIAGNOSIS — Y92019 Unspecified place in single-family (private) house as the place of occurrence of the external cause: Secondary | ICD-10-CM | POA: Diagnosis not present

## 2020-07-13 DIAGNOSIS — Z9181 History of falling: Secondary | ICD-10-CM

## 2020-07-13 DIAGNOSIS — G928 Other toxic encephalopathy: Secondary | ICD-10-CM | POA: Diagnosis not present

## 2020-07-13 DIAGNOSIS — G9340 Encephalopathy, unspecified: Secondary | ICD-10-CM | POA: Diagnosis present

## 2020-07-13 DIAGNOSIS — R296 Repeated falls: Secondary | ICD-10-CM | POA: Diagnosis present

## 2020-07-13 DIAGNOSIS — R29898 Other symptoms and signs involving the musculoskeletal system: Secondary | ICD-10-CM

## 2020-07-13 DIAGNOSIS — I129 Hypertensive chronic kidney disease with stage 1 through stage 4 chronic kidney disease, or unspecified chronic kidney disease: Secondary | ICD-10-CM | POA: Diagnosis present

## 2020-07-13 DIAGNOSIS — N390 Urinary tract infection, site not specified: Secondary | ICD-10-CM | POA: Diagnosis not present

## 2020-07-13 DIAGNOSIS — S0101XA Laceration without foreign body of scalp, initial encounter: Secondary | ICD-10-CM | POA: Diagnosis present

## 2020-07-13 DIAGNOSIS — I10 Essential (primary) hypertension: Secondary | ICD-10-CM | POA: Diagnosis not present

## 2020-07-13 LAB — CBC WITH DIFFERENTIAL/PLATELET
Abs Immature Granulocytes: 0.04 10*3/uL (ref 0.00–0.07)
Basophils Absolute: 0 10*3/uL (ref 0.0–0.1)
Basophils Relative: 0 %
Eosinophils Absolute: 0 10*3/uL (ref 0.0–0.5)
Eosinophils Relative: 0 %
HCT: 37.8 % (ref 36.0–46.0)
Hemoglobin: 12.2 g/dL (ref 12.0–15.0)
Immature Granulocytes: 0 %
Lymphocytes Relative: 9 %
Lymphs Abs: 0.8 10*3/uL (ref 0.7–4.0)
MCH: 31 pg (ref 26.0–34.0)
MCHC: 32.3 g/dL (ref 30.0–36.0)
MCV: 96.2 fL (ref 80.0–100.0)
Monocytes Absolute: 0.7 10*3/uL (ref 0.1–1.0)
Monocytes Relative: 7 %
Neutro Abs: 8.4 10*3/uL — ABNORMAL HIGH (ref 1.7–7.7)
Neutrophils Relative %: 84 %
Platelets: 217 10*3/uL (ref 150–400)
RBC: 3.93 MIL/uL (ref 3.87–5.11)
RDW: 13 % (ref 11.5–15.5)
WBC: 9.9 10*3/uL (ref 4.0–10.5)
nRBC: 0 % (ref 0.0–0.2)

## 2020-07-13 LAB — COMPREHENSIVE METABOLIC PANEL
ALT: 8 U/L (ref 0–44)
AST: 19 U/L (ref 15–41)
Albumin: 3.4 g/dL — ABNORMAL LOW (ref 3.5–5.0)
Alkaline Phosphatase: 110 U/L (ref 38–126)
Anion gap: 8 (ref 5–15)
BUN: 30 mg/dL — ABNORMAL HIGH (ref 8–23)
CO2: 23 mmol/L (ref 22–32)
Calcium: 8.6 mg/dL — ABNORMAL LOW (ref 8.9–10.3)
Chloride: 105 mmol/L (ref 98–111)
Creatinine, Ser: 1.69 mg/dL — ABNORMAL HIGH (ref 0.44–1.00)
GFR, Estimated: 29 mL/min — ABNORMAL LOW (ref >60)
Glucose, Bld: 126 mg/dL — ABNORMAL HIGH (ref 70–99)
Potassium: 3.8 mmol/L (ref 3.5–5.1)
Sodium: 136 mmol/L (ref 135–145)
Total Bilirubin: 1.7 mg/dL — ABNORMAL HIGH (ref 0.3–1.2)
Total Protein: 6.6 g/dL (ref 6.5–8.1)

## 2020-07-13 LAB — CK: Total CK: 192 U/L (ref 38–234)

## 2020-07-13 LAB — RESP PANEL BY RT-PCR (FLU A&B, COVID) ARPGX2
Influenza A by PCR: NEGATIVE
Influenza B by PCR: NEGATIVE
SARS Coronavirus 2 by RT PCR: NEGATIVE

## 2020-07-13 LAB — GLUCOSE, CAPILLARY: Glucose-Capillary: 112 mg/dL — ABNORMAL HIGH (ref 70–99)

## 2020-07-13 MED ORDER — ONDANSETRON HCL 4 MG/2ML IJ SOLN: 4.0000 mg | Freq: Four times a day (QID) | INTRAMUSCULAR | Status: DC | PRN

## 2020-07-13 MED ORDER — SIMVASTATIN 20 MG PO TABS
20.0000 mg | ORAL_TABLET | Freq: Every day | ORAL | Status: DC
  Administered 2020-07-14 – 2020-07-20 (×7): 20 mg via ORAL
  Filled 2020-07-13 (×7): qty 1

## 2020-07-13 MED ORDER — ACETAMINOPHEN 325 MG PO TABS
650.0000 mg | ORAL_TABLET | Freq: Four times a day (QID) | ORAL | Status: DC | PRN
  Administered 2020-07-14: 650 mg via ORAL
  Filled 2020-07-13: qty 2

## 2020-07-13 MED ORDER — ONDANSETRON HCL 4 MG PO TABS: 4.0000 mg | ORAL_TABLET | Freq: Four times a day (QID) | ORAL | Status: DC | PRN

## 2020-07-13 MED ORDER — HYDROCODONE-ACETAMINOPHEN 5-325 MG PO TABS
1.0000 | ORAL_TABLET | Freq: Four times a day (QID) | ORAL | Status: DC | PRN
  Administered 2020-07-13 – 2020-07-14 (×2): 1 via ORAL
  Filled 2020-07-13 (×2): qty 1

## 2020-07-13 MED ORDER — LORAZEPAM 2 MG/ML IJ SOLN: 1.0000 mg | Freq: Every day | INTRAMUSCULAR | Status: DC

## 2020-07-13 MED ORDER — TRAMADOL HCL 50 MG PO TABS
50.0000 mg | ORAL_TABLET | Freq: Two times a day (BID) | ORAL | Status: DC
  Administered 2020-07-13 – 2020-07-20 (×14): 50 mg via ORAL
  Filled 2020-07-13 (×14): qty 1

## 2020-07-13 MED ORDER — METOPROLOL SUCCINATE ER 25 MG PO TB24
25.0000 mg | ORAL_TABLET | Freq: Every day | ORAL | Status: DC
  Administered 2020-07-14 – 2020-07-20 (×7): 25 mg via ORAL
  Filled 2020-07-13 (×7): qty 1

## 2020-07-13 MED ORDER — LACTATED RINGERS IV SOLN: INTRAVENOUS | Status: DC

## 2020-07-13 MED ORDER — LORAZEPAM 1 MG PO TABS
1.0000 mg | ORAL_TABLET | Freq: Every day | ORAL | Status: DC
  Administered 2020-07-13 – 2020-07-19 (×7): 1 mg via ORAL
  Filled 2020-07-13 (×7): qty 1

## 2020-07-13 MED ORDER — ACETAMINOPHEN 650 MG RE SUPP: 650.0000 mg | Freq: Four times a day (QID) | RECTAL | Status: DC | PRN

## 2020-07-13 NOTE — ED Notes (Signed)
[facility] is here. Patient is ready for transport

## 2020-07-13 NOTE — ED Provider Notes (Signed)
[facility]   CSN: [id number] Arrival date & time: 07/13/20  1538     History Chief Complaint  Patient presents with   Fall   AMS    Diane Nolan is a 87 y.o. female.  The history is provided by the patient, the EMS personnel and medical records.  Fall Diane Nolan is a 87 y.o. female who presents to the Emergency Department complaining of fall.  Level V caveat due to confusion.  Hx is provided by EMS. She presents the emergency department by EMS from home for evaluation following an unwitnessed fall. She lives at home alone and her son was coming over to pick her up for a doctors appointment. The appointment was due to a recent fall over the weekend. When he found her she was on the ground trying to put on pants. She seemed confused compared to her baseline. She did have lacerations to her scalp. Patient reports pain to her head and neck, denies any recent illnesses or additional symptoms.     Past Medical History:  Diagnosis Date   Diabetes mellitus    HLD (hyperlipidemia)    Hypertension    Intracervical pessary    in place-dr henley manages    Patient Active Problem List   Diagnosis Date Noted   Odontoid fracture with type II morphology and posterior displacement (HCC) 07/13/2020   Renal insufficiency 07/13/2020   Acute encephalopathy 07/13/2020   Chest pain, rule out acute myocardial infarction 01/04/2016   HTN (hypertension) 01/04/2016   DM2 (diabetes mellitus, type 2) (HCC) 01/04/2016    Past Surgical History:  Procedure Laterality Date   CHILD BIRTH  X1   CHOLECYSTECTOMY  06/01/2011   Procedure: LAPAROSCOPIC CHOLECYSTECTOMY WITH INTRAOPERATIVE CHOLANGIOGRAM;  Surgeon: Maisie Fus A. Cornett, MD;  Location: [facility];  Service: General;  Laterality: N/A;  Laparoscopic cholecystectomy with cholangiogram     OB History   No obstetric history on file.     Family History  Problem Relation Age of  Onset   Other Mother        Old Age   Diabetes Father     Social History   Tobacco Use   Smoking status: Never   Smokeless tobacco: Never  Substance Use Topics   Alcohol use: No   Drug use: No    Home Medications Prior to Admission medications   Medication Sig Start Date End Date Taking? Authorizing Provider  acetaminophen (TYLENOL) 650 MG CR tablet Take 1,300 mg by mouth every 8 (eight) hours as needed for pain.   Yes [provider]  Boswellia-Glucosamine-Vit D (OSTEO BI-FLEX ONE PER DAY PO) Take 2 tablets by mouth daily.   Yes [provider]  LORazepam (ATIVAN) 2 MG tablet Take 2 mg by mouth at bedtime. 07/06/20  Yes [provider]  metoprolol succinate (TOPROL-XL) 25 MG 24 hr tablet Take 25 mg by mouth daily. 11/20/15  Yes [provider]  simvastatin (ZOCOR) 20 MG tablet Take 20 mg by mouth daily at 6 (six) AM.   Yes [provider]  traMADol (ULTRAM) 50 MG tablet Take 50 mg by mouth 2 (two) times daily. 06/18/20  Yes [provider]    Allergies    Patient has no known allergies.  Review of Systems   Review of Systems  All other systems reviewed and are negative.  Physical Exam Updated Vital Signs BP 122/61 (BP Location: Right Arm)   Pulse 79   Temp 98.9  F (37.2 C) (Oral)   Resp 18   Ht 5' 0.5" (1.537 m)   Wt 49.9 kg   SpO2 98%   BMI 21.13 kg/m   Physical Exam Vitals and nursing note reviewed.  Constitutional:      Appearance: She is well-developed.  HENT:     Head: Normocephalic.     Comments: Scalp laceration Neck:     Comments: Mild midline cervical spine tenderness. No thoracic or lumbar spine tenderness to palpation Cardiovascular:     Rate and Rhythm: Normal rate and regular rhythm.     Heart sounds: No murmur heard. Pulmonary:     Effort: Pulmonary effort is normal. No respiratory distress.     Breath sounds: Normal breath sounds.  Abdominal:     Palpations: Abdomen is soft.      Tenderness: There is no abdominal tenderness. There is no guarding or rebound.  Musculoskeletal:        General: No tenderness.     Comments: No tenderness to palpation over the hips. There is minimal tenderness to palpation over the left knee.  Skin:    General: Skin is warm and dry.  Neurological:     Mental Status: She is alert.     Comments: Oriented to person in place, disoriented to time. Five out of five strength and bilateral upper extremities, 4 to 5 strength and bilateral lower extremities.  Psychiatric:        Behavior: Behavior normal.    ED Results / Procedures / Treatments   Labs (all labs ordered are listed, but only abnormal results are displayed) Labs Reviewed  CBC WITH DIFFERENTIAL/PLATELET - Abnormal; Notable for the following components:      Result Value   Neutro Abs 8.4 (*)    All other components within normal limits  COMPREHENSIVE METABOLIC PANEL - Abnormal; Notable for the following components:   Glucose, Bld 126 (*)    BUN 30 (*)    Creatinine, Ser 1.69 (*)    Calcium 8.6 (*)    Albumin 3.4 (*)    Total Bilirubin 1.7 (*)    GFR, Estimated 29 (*)    All other components within normal limits  GLUCOSE, CAPILLARY - Abnormal; Notable for the following components:   Glucose-Capillary 112 (*)    All other components within normal limits  RESP PANEL BY RT-PCR (FLU A&B, COVID) [id number]  CK    EKG EKG Interpretation  Date/Time:  Monday July 13 2020 15:56:00 EDT Ventricular Rate:  81 PR Interval:  131 QRS Duration: 75 QT Interval:  404 QTC Calculation: 469 R Axis:   48 Text Interpretation: Sinus rhythm Probable left atrial enlargement Abnormal R-wave progression, early transition Confirmed by Tilden Fossa [phone]) on 07/13/2020 4:05:46 PM  Radiology DG Chest 1 View  Result Date: 07/13/2020 CLINICAL DATA:  Pain following fall EXAM: CHEST  1 VIEW COMPARISON:  January 04, 2016 FINDINGS: Lungs are clear. Heart size and pulmonary vascularity are normal.  There is aortic atherosclerosis. No adenopathy. Degenerative change noted in each shoulder. No pneumothorax. IMPRESSION: Lungs clear. Heart size normal. Aortic Atherosclerosis (ICD10-I70.0). Electronically Signed   By: Bretta Bang III M.D.   On: 07/13/2020 16:46   CT Head Wo Contrast  Result Date: 07/13/2020 CLINICAL DATA:  Recent fall with headaches and neck pain, initial encounter EXAM: CT HEAD WITHOUT CONTRAST CT CERVICAL SPINE WITHOUT CONTRAST TECHNIQUE: Multidetector CT imaging of the head and cervical spine was performed following the standard protocol without intravenous contrast. Multiplanar CT image  reconstructions of the cervical spine were also generated. COMPARISON:  None. FINDINGS: CT HEAD FINDINGS Brain: No evidence of acute infarction, hemorrhage, hydrocephalus, extra-axial collection or mass lesion/mass effect. Mild atrophic changes are noted. Vascular: No hyperdense vessel or unexpected calcification. Skull: Normal. Negative for fracture or focal lesion. Sinuses/Orbits: No acute finding. Other: None. CT CERVICAL SPINE FINDINGS Alignment: Within normal limits with the exception of the odontoid Skull base and vertebrae: There is a fracture at the base of the odontoid with the proximally 1.5 mm of posterior displacement of the odontoid with respect to the body of C2. No significant central canal stenosis is noted. Multilevel facet hypertrophic changes are noted. Disc space narrowing is noted from C3 to C6 with associated osteophytic changes. No other fractures are seen. Soft tissues and spinal canal: Surrounding soft tissue structures are within normal limits. Upper chest: Visualized lung apices are within normal limits. Other: None IMPRESSION: CT of the head: No acute intracranial abnormality noted. CT of the cervical spine: Fracture through the base of the odontoid with only minimal posterior displacement with respect to the body of C2. No other focal fracture is seen. Multilevel  degenerative change. Critical Value/emergent results were called by telephone at the time of interpretation on 07/13/2020 at 4:50 pm to Dr. Tilden FossaELIZABETH Makel Mcmann , who verbally acknowledged these results. Electronically Signed   By: Alcide CleverMark  Lukens M.D.   On: 07/13/2020 16:51   CT Cervical Spine Wo Contrast  Result Date: 07/13/2020 CLINICAL DATA:  Recent fall with headaches and neck pain, initial encounter EXAM: CT HEAD WITHOUT CONTRAST CT CERVICAL SPINE WITHOUT CONTRAST TECHNIQUE: Multidetector CT imaging of the head and cervical spine was performed following the standard protocol without intravenous contrast. Multiplanar CT image reconstructions of the cervical spine were also generated. COMPARISON:  None. FINDINGS: CT HEAD FINDINGS Brain: No evidence of acute infarction, hemorrhage, hydrocephalus, extra-axial collection or mass lesion/mass effect. Mild atrophic changes are noted. Vascular: No hyperdense vessel or unexpected calcification. Skull: Normal. Negative for fracture or focal lesion. Sinuses/Orbits: No acute finding. Other: None. CT CERVICAL SPINE FINDINGS Alignment: Within normal limits with the exception of the odontoid Skull base and vertebrae: There is a fracture at the base of the odontoid with the proximally 1.5 mm of posterior displacement of the odontoid with respect to the body of C2. No significant central canal stenosis is noted. Multilevel facet hypertrophic changes are noted. Disc space narrowing is noted from C3 to C6 with associated osteophytic changes. No other fractures are seen. Soft tissues and spinal canal: Surrounding soft tissue structures are within normal limits. Upper chest: Visualized lung apices are within normal limits. Other: None IMPRESSION: CT of the head: No acute intracranial abnormality noted. CT of the cervical spine: Fracture through the base of the odontoid with only minimal posterior displacement with respect to the body of C2. No other focal fracture is seen. Multilevel  degenerative change. Critical Value/emergent results were called by telephone at the time of interpretation on 07/13/2020 at 4:50 pm to Dr. Tilden FossaELIZABETH Dorine Duffey , who verbally acknowledged these results. Electronically Signed   By: Alcide CleverMark  Lukens M.D.   On: 07/13/2020 16:51    Procedures Procedures   Medications Ordered in ED Medications  lactated ringers infusion ( Intravenous Restarted 07/13/20 2259)  acetaminophen (TYLENOL) tablet 650 mg (has no administration in time range)    Or  acetaminophen (TYLENOL) suppository 650 mg (has no administration in time range)  ondansetron (ZOFRAN) tablet 4 mg (has no administration in time range)  Or  ondansetron (ZOFRAN) injection 4 mg (has no administration in time range)  metoprolol succinate (TOPROL-XL) 24 hr tablet 25 mg (has no administration in time range)  simvastatin (ZOCOR) tablet 20 mg (has no administration in time range)  traMADol (ULTRAM) tablet 50 mg (50 mg Oral Given 07/13/20 2254)  LORazepam (ATIVAN) tablet 1 mg (1 mg Oral Given 07/13/20 2254)  HYDROcodone-acetaminophen (NORCO/VICODIN) 5-325 MG per tablet 1 tablet (1 tablet Oral Given 07/13/20 2112)    ED Course  I have reviewed the triage vital signs and the nursing notes.  Pertinent labs & imaging results that were available during my care of the patient were reviewed by me and considered in my medical decision making (see chart for details).    MDM Rules/Calculators/A&P                         patient here for home for evaluation after being confused, found on the ground. She does complain of neck pain. She has lower extremity weakness but no upper extremity weakness CT scan is significant for C2 fracture. Discussed with on-call provider for neurosurgery - recommends aspen collar with outpatient neurosurgery followup. Labs with elevation and creatinine when compared to prior, it is been five years since her last creatinine is available. Unclear how acute this is. Given patient's new  confusion, frequent falls medicine consulted for admission.  Final Clinical Impression(s) / ED Diagnoses Final diagnoses:  Closed displaced fracture of second cervical vertebra, unspecified fracture morphology, initial encounter (HCC)  Laceration of scalp, initial encounter  Fall, initial encounter    Rx / DC Orders ED Discharge Orders     None        Tilden Fossa, MD 07/13/20 2324

## 2020-07-13 NOTE — ED Notes (Signed)
Report given Danielle Rankin, RN

## 2020-07-13 NOTE — H&P (Signed)
History and Physical    Carrah Eppolito ZJQ:[id number] DOB: [birth year] DOA: 07/13/2020  PCP: Burton Apley, MD  Patient coming from: Home  I have personally briefly reviewed patient's old medical records in [facility]  Chief Complaint: AMS, falls  HPI: Joclyn Alsobrook is a 87 y.o. female with medical history significant of HTN, DM2 (chart diagnosis, not on any meds for DM).  Pt with mechanical fall over the weekend.  L leg knee pain / weakness since time of fall.  Actually was going to see PCP today for this.  Son came to pick her up for that appointment.  Found her down on ground with confusion compared to baseline.  Also new onset neck pain and head lac.  Pt is constant, moderate, nothing makes better.  Pt denies any other recent illness or additional symptoms.  Denies fever (subjective), urinary symptoms, CP, N/V, abd pain.   ED Course: Pt found to have type 2 odontoid fx on CT scan.  Pt also running a temp of 100.2 at the time of my eval in ED.  CXR neg.  UA pending still.  Creat 1.69 but per son pt has known "weak kidneys" diagnosed by PCP in recent months-years.   Review of Systems: As per HPI, otherwise all review of systems negative.  Past Medical History:  Diagnosis Date   Diabetes mellitus    HLD (hyperlipidemia)    Hypertension    Intracervical pessary    in place-dr henley manages    Past Surgical History:  Procedure Laterality Date   CHILD BIRTH  X1   CHOLECYSTECTOMY  06/01/2011   Procedure: LAPAROSCOPIC CHOLECYSTECTOMY WITH INTRAOPERATIVE CHOLANGIOGRAM;  Surgeon: Clovis Pu. Cornett, MD;  Location: [facility];  Service: General;  Laterality: N/A;  Laparoscopic cholecystectomy with cholangiogram     reports that she has never smoked. She has never used smokeless tobacco. She reports that she does not drink alcohol and does not use drugs.  No Known Allergies  Family History  Problem Relation Age of Onset   Other Mother        Old  Age   Diabetes Father      Prior to Admission medications   Medication Sig Start Date End Date Taking? Authorizing Provider  acetaminophen (TYLENOL) 650 MG CR tablet Take 1,300 mg by mouth every 8 (eight) hours as needed for pain.   Yes [provider]  Boswellia-Glucosamine-Vit D (OSTEO BI-FLEX ONE PER DAY PO) Take 2 tablets by mouth daily.   Yes [provider]  LORazepam (ATIVAN) 2 MG tablet Take 2 mg by mouth at bedtime. 07/06/20  Yes [provider]  metoprolol succinate (TOPROL-XL) 25 MG 24 hr tablet Take 25 mg by mouth daily. 11/20/15  Yes [provider]  simvastatin (ZOCOR) 20 MG tablet Take 20 mg by mouth daily at 6 (six) AM.   Yes [provider]  traMADol (ULTRAM) 50 MG tablet Take 50 mg by mouth 2 (two) times daily. 06/18/20  Yes [provider]    Physical Exam: Vitals:   07/13/20 2000 07/13/20 2030 07/13/20 2120 07/13/20 2120  BP: 131/68 119/68    Pulse: 78 86    Resp: (!) 21 20    Temp:   98.8 F (37.1 C) 99.9 F (37.7 C)  TempSrc:   Oral Axillary  SpO2: 100% 100%    Weight:      Height:        Constitutional: NAD, calm, comfortable Eyes: PERRL, lids and conjunctivae normal  ENMT: Mucous membranes are moist. Posterior pharynx clear of any exudate or lesions.Normal dentition.  Neck: normal, supple, no masses, no thyromegaly Respiratory: clear to auscultation bilaterally, no wheezing, no crackles. Normal respiratory effort. No accessory muscle use.  Cardiovascular: Regular rate and rhythm, no murmurs / rubs / gallops. No extremity edema. 2+ pedal pulses. No carotid bruits.  Abdomen: no tenderness, no masses palpated. No hepatosplenomegaly. Bowel sounds positive.  Musculoskeletal: no clubbing / cyanosis. No joint deformity upper and lower extremities. Good ROM, no contractures. Normal muscle tone.  Skin: no rashes, lesions, ulcers. No induration Neurologic: CN 2-12 grossly intact. Sensation intact, DTR normal.  Strength 5/5 in all 4.  Psychiatric: Normal judgment and insight. Alert and oriented x 3. Normal mood.    Labs on Admission: I have personally reviewed following labs and imaging studies  CBC: Recent Labs  Lab 07/13/20 1607  WBC 9.9  NEUTROABS 8.4*  HGB 12.2  HCT 37.8  MCV 96.2  PLT 217   Basic Metabolic Panel: Recent Labs  Lab 07/13/20 1636  NA 136  K 3.8  CL 105  CO2 23  GLUCOSE 126*  BUN 30*  CREATININE 1.69*  CALCIUM 8.6*   GFR: Estimated Creatinine Clearance: 16.3 mL/min (A) (by C-G formula based on SCr of 1.69 mg/dL (H)). Liver Function Tests: Recent Labs  Lab 07/13/20 1636  AST 19  ALT 8  ALKPHOS 110  BILITOT 1.7*  PROT 6.6  ALBUMIN 3.4*   No results for input(s): LIPASE, AMYLASE in the last 168 hours. No results for input(s): AMMONIA in the last 168 hours. Coagulation Profile: No results for input(s): INR, PROTIME in the last 168 hours. Cardiac Enzymes: Recent Labs  Lab 07/13/20 1636  CKTOTAL 192   BNP (last 3 results) No results for input(s): PROBNP in the last 8760 hours. HbA1C: No results for input(s): HGBA1C in the last 72 hours. CBG: No results for input(s): GLUCAP in the last 168 hours. Lipid Profile: No results for input(s): CHOL, HDL, LDLCALC, TRIG, CHOLHDL, LDLDIRECT in the last 72 hours. Thyroid Function Tests: No results for input(s): TSH, T4TOTAL, FREET4, T3FREE, THYROIDAB in the last 72 hours. Anemia Panel: No results for input(s): VITAMINB12, FOLATE, FERRITIN, TIBC, IRON, RETICCTPCT in the last 72 hours. Urine analysis:    Component Value Date/Time   COLORURINE YELLOW 04/29/2011 1132   APPEARANCEUR CLOUDY (A) 04/29/2011 1132   LABSPEC 1.010 04/29/2011 1132   PHURINE 6.5 04/29/2011 1132   GLUCOSEU NEGATIVE 04/29/2011 1132   HGBUR TRACE (A) 04/29/2011 1132   BILIRUBINUR NEGATIVE 04/29/2011 1132   KETONESUR NEGATIVE 04/29/2011 1132   PROTEINUR NEGATIVE 04/29/2011 1132   UROBILINOGEN 0.2 04/29/2011 1132   NITRITE POSITIVE  (A) 04/29/2011 1132   LEUKOCYTESUR MODERATE (A) 04/29/2011 1132    Radiological Exams on Admission: DG Chest 1 View  Result Date: 07/13/2020 CLINICAL DATA:  Pain following fall EXAM: CHEST  1 VIEW COMPARISON:  January 04, 2016 FINDINGS: Lungs are clear. Heart size and pulmonary vascularity are normal. There is aortic atherosclerosis. No adenopathy. Degenerative change noted in each shoulder. No pneumothorax. IMPRESSION: Lungs clear. Heart size normal. Aortic Atherosclerosis (ICD10-I70.0). Electronically Signed   By: Bretta Bang III M.D.   On: 07/13/2020 16:46   CT Head Wo Contrast  Result Date: 07/13/2020 CLINICAL DATA:  Recent fall with headaches and neck pain, initial encounter EXAM: CT HEAD WITHOUT CONTRAST CT CERVICAL SPINE WITHOUT CONTRAST TECHNIQUE: Multidetector CT imaging of the head and cervical spine was performed following the standard protocol without intravenous  contrast. Multiplanar CT image reconstructions of the cervical spine were also generated. COMPARISON:  None. FINDINGS: CT HEAD FINDINGS Brain: No evidence of acute infarction, hemorrhage, hydrocephalus, extra-axial collection or mass lesion/mass effect. Mild atrophic changes are noted. Vascular: No hyperdense vessel or unexpected calcification. Skull: Normal. Negative for fracture or focal lesion. Sinuses/Orbits: No acute finding. Other: None. CT CERVICAL SPINE FINDINGS Alignment: Within normal limits with the exception of the odontoid Skull base and vertebrae: There is a fracture at the base of the odontoid with the proximally 1.5 mm of posterior displacement of the odontoid with respect to the body of C2. No significant central canal stenosis is noted. Multilevel facet hypertrophic changes are noted. Disc space narrowing is noted from C3 to C6 with associated osteophytic changes. No other fractures are seen. Soft tissues and spinal canal: Surrounding soft tissue structures are within normal limits. Upper chest: Visualized  lung apices are within normal limits. Other: None IMPRESSION: CT of the head: No acute intracranial abnormality noted. CT of the cervical spine: Fracture through the base of the odontoid with only minimal posterior displacement with respect to the body of C2. No other focal fracture is seen. Multilevel degenerative change. Critical Value/emergent results were called by telephone at the time of interpretation on 07/13/2020 at 4:50 pm to Dr. Tilden FossaELIZABETH REES , who verbally acknowledged these results. Electronically Signed   By: Alcide CleverMark  Lukens M.D.   On: 07/13/2020 16:51   CT Cervical Spine Wo Contrast  Result Date: 07/13/2020 CLINICAL DATA:  Recent fall with headaches and neck pain, initial encounter EXAM: CT HEAD WITHOUT CONTRAST CT CERVICAL SPINE WITHOUT CONTRAST TECHNIQUE: Multidetector CT imaging of the head and cervical spine was performed following the standard protocol without intravenous contrast. Multiplanar CT image reconstructions of the cervical spine were also generated. COMPARISON:  None. FINDINGS: CT HEAD FINDINGS Brain: No evidence of acute infarction, hemorrhage, hydrocephalus, extra-axial collection or mass lesion/mass effect. Mild atrophic changes are noted. Vascular: No hyperdense vessel or unexpected calcification. Skull: Normal. Negative for fracture or focal lesion. Sinuses/Orbits: No acute finding. Other: None. CT CERVICAL SPINE FINDINGS Alignment: Within normal limits with the exception of the odontoid Skull base and vertebrae: There is a fracture at the base of the odontoid with the proximally 1.5 mm of posterior displacement of the odontoid with respect to the body of C2. No significant central canal stenosis is noted. Multilevel facet hypertrophic changes are noted. Disc space narrowing is noted from C3 to C6 with associated osteophytic changes. No other fractures are seen. Soft tissues and spinal canal: Surrounding soft tissue structures are within normal limits. Upper chest: Visualized  lung apices are within normal limits. Other: None IMPRESSION: CT of the head: No acute intracranial abnormality noted. CT of the cervical spine: Fracture through the base of the odontoid with only minimal posterior displacement with respect to the body of C2. No other focal fracture is seen. Multilevel degenerative change. Critical Value/emergent results were called by telephone at the time of interpretation on 07/13/2020 at 4:50 pm to Dr. Tilden FossaELIZABETH REES , who verbally acknowledged these results. Electronically Signed   By: Alcide CleverMark  Lukens M.D.   On: 07/13/2020 16:51    EKG: Independently reviewed.  Assessment/Plan Principal Problem:   Odontoid fracture with type II morphology and posterior displacement (HCC) Active Problems:   Renal insufficiency   Acute encephalopathy    Odontoid fx type 2 - Pt in ASPEN collar [facility] as these usually end up being surgical eventually. Doesn't  sound like this is going to be immediately surgical tonight however: Will let pt take POs up until midnight Putting pt on IVF: LR at 75 [employer] PRN pain Knee pain - Ongoing after fall this weekend, before second fall and neck pain today Getting B knee x rays and pelvis x ray Renal insufficiency - Although last creat we have on file was normal, this was in 2017 History from son however, makes it sound like she has some mild CKD developing since that time so this may well be her baseline today. Gentle hydration with IVF Repeat BMP in AM Strict intake and output Check bladder scan Check UA May want to get renal US in AM for further work up if we cant confirm what her baseline is. ? Fever - Tm 100.2 in ED No clear source UA still pending though Will check BCx as well Acute encephalopathy - Delirium due to one or multiple of the above most likely.  DVT prophylaxis: SCDs Code Status: Full Family Communication: Son at bedside Disposition Plan: Home after delirium resolved and cleared by  NS Consults called: NS called by EDP Admission status: Admit to inpatient  Severity of Illness: The appropriate patient status for this patient is INPATIENT. Inpatient status is judged to be reasonable and necessary in order to provide the required intensity of service to ensure the patient's safety. The patient's presenting symptoms, physical exam findings, and initial radiographic and laboratory data in the context of their chronic comorbidities is felt to place them at high risk for further clinical deterioration. Furthermore, it is not anticipated that the patient will be medically stable for discharge from the hospital within 2 midnights of admission. The following factors support the patient status of inpatient.   IP status due to delirium, frequent falls, unstable fx of neck.  * I certify that at the point of admission it is my clinical judgment that the patient will require inpatient hospital care spanning beyond 2 midnights from the point of admission due to high intensity of service, high risk for further deterioration and high frequency of surveillance required.*   Jream Broyles M. DO [facility]  How to contact the [facility] Attending or Consulting provider 7A - 7P or covering provider during after hours 7P -7A, for this patient?  Check the care team in [facility] and look for a) attending/consulting [facility] provider listed and b) the [facility] team listed Log into [email or web address]  [employer] Physician Scheduling and messaging for groups and whole hospitals  On call and physician scheduling software for group practices, residents, hospitalists and other medical providers for call, clinic, rotation and shift schedules. On[employer] is a hospital-wide system for scheduling doctors and paging doctors on call. EasyPlot is for scientific plotting and data analysis.  [email or web address]  and use [facility]'s universal password to access. If you do not have the password, please contact the hospital operator.   Locate the [facility] provider you are looking for under [facility] and page to a number that you can be directly reached. If you still have difficulty reaching the provider, please page the [facility] (Director on Call) for the Hospitalists listed on amion for assistance.  07/13/2020, 9:51 PM

## 2020-07-13 NOTE — ED Notes (Signed)
Called ortho tech to apply brace. Brace is already applied.

## 2020-07-13 NOTE — ED Notes (Signed)
Dr Julian Reil at bedside. Patient and son are aware of transfer to [facility].

## 2020-07-13 NOTE — ED Provider Notes (Signed)
..  Laceration Repair  Date/Time: 07/13/2020 5:48 PM Performed by: Alveria Apley, PA-C Authorized by: Alveria Apley, PA-C   Consent:    Consent obtained:  Verbal   Consent given by:  Patient (and son, Onalee Hua)   Risks discussed:  Infection, need for additional repair, pain, poor cosmetic result and poor wound healing Anesthesia:    Anesthesia method:  None Laceration details:    Location:  Scalp   Scalp location:  Crown   Wound length (cm): L shaped laceration 2.5 cm on each side. Pre-procedure details:    Preparation:  Imaging obtained to evaluate for foreign bodies Skin repair:    Repair method:  Staples   Number of staples:  7 Approximation:    Approximation:  Close Repair type:    Repair type:  Simple Post-procedure details:    Dressing:  Open (no dressing)   Procedure completion:  Tolerated well, no immediate complications    Alveria Apley, PA-C 07/13/20 1749    Tilden Fossa, MD 07/13/20 2255

## 2020-07-13 NOTE — ED Triage Notes (Signed)
Pt arrived via [facility] with c/o a fall and AMS. EMS reports pt lives alone at home and her son was coming to take her to a doctors appointment for a recent fall over the weekend. When the son went to pick the pt up, she was found on the ground trying to put pants on. Pt's son reports she seemed confused in which is not her normal. Pt does have small lacerations in her scalp and reports pain in her knees. No other complaints at this time.   Vitals  124/66 HR-87 99% room air T-97.7 CBG-153

## 2020-07-14 DIAGNOSIS — E785 Hyperlipidemia, unspecified: Secondary | ICD-10-CM

## 2020-07-14 DIAGNOSIS — I10 Essential (primary) hypertension: Secondary | ICD-10-CM

## 2020-07-14 MED ORDER — FLUCONAZOLE 150 MG PO TABS
150.0000 mg | ORAL_TABLET | Freq: Once | ORAL | Status: AC
  Administered 2020-07-14: 150 mg via ORAL
  Filled 2020-07-14: qty 1

## 2020-07-14 NOTE — Progress Notes (Signed)
Patient requested SCDs off and said "I don't really like these leg compressions, please take them off." Patient was educated on the reason of having the SCDs placed on her legs. She still requested them to be taken off.   Patient had IV running and wanted to stop her IV fluids. She didn't understand why she needed them since she was eating and drinking. I explained and educated her on the reason being due to her kidneys and MD orders but she said "Well, I understand but I don't think I need it.. Turn it off."

## 2020-07-14 NOTE — Progress Notes (Signed)
PROGRESS NOTE    Diane Nolan  IRC:[id number] DOB: 06/23/1928 DOA: 07/13/2020 PCP: Burton Apley, MD    Brief Narrative:  Diane Nolan is a 87 year old female with past medical history significant for essential hypertension, type 2 diabetes mellitus who presented to [facility] via EMS after being found down at home following an unwitnessed fall.  Patient was found by her son who was coming over to pick her up for a doctor's appointment; for apparently recurrent falls.  In the ED, her 98.4 F, HR 84, RR 16, BP 118/64, SPO2 100% on room air.  WBC 9.9, hemoglobin 12.2, platelets 217.  Sodium 136, potassium 3.8, chloride 105, CO2 23, glucose 126, BUN 30, creatinine 1.69.  AST 19, ALT 8, total bilirubin 1.7.  COVID-19 PCR negative.  Influenza A/B PCR negative.  CT head with no acute intercranial abnormality.  CT C-spine with fracture through the base of the odontoid with minimal posterior displacement with respect to the body of C2, no other focal fracture noted.  Chest x-ray with no acute cardiopulmonary disease process, noted aortic atherosclerosis.  Left knee x-ray with severe tricompartmental degenerative arthritis, no acute fracture/dislocation.  Right knee x-ray with advanced degenerative changes, no acute bony abnormality.  Pelvis x-ray, no acute bony abnormality.  Neurosurgery consulted.  TRH consulted for further evaluation and management of acute C2 fracture with associated recurrent falls and acute renal failure.  Patient was transferred to [facility] for neurosurgical evaluation.   Assessment & Plan:   Principal Problem:   Odontoid fracture with type II morphology and posterior displacement (HCC) Active Problems:   Renal insufficiency   Acute encephalopathy   Odontoid fracture with type II morphology and posterior displacement Resenting from home after being found down by son for on known time.  Patient with history of recurrent falls.  CT head unrevealing with CT C-spine with  odontoid fracture with minimal posterior displacement. --Neurosurgery evaluation/recommendations: Pending --Continue Aspen collar; must remain in Aspen collar per discussion with neurosurgery this morning --Tramadol 50 mg p.o. twice daily --Norco 5-325 mg p.o. every 6 hours as needed moderate pain --PT/OT evaluation --Fall precautions  Acute renal failure Creatinine 1.69 at time of admission.  Baseline 0.89 from 01/04/2016.  Etiology likely secondary to prerenal azotemia from dehydration after being found down. --IVF with LR at 75 mL/h --Repeat BMP in a.m.  Essential hypertension BP 130/55 this morning, well controlled --Metoprolol succinate 25 mg p.o. daily  HLD: Simvastatin 20 mg p.o. daily  Anxiety: Lorazepam 1 mg p.o. qHS  Advanced degenerative joint disease Severe tricompartmental disease noted on left knee with advanced degenerative disease noted on right.  On tramadol 50 mg p.o. twice daily at home. --Continue Tylenol, home tramadol, as needed Norco   DVT prophylaxis: SCDs Start: 07/13/20 2003   Code Status: Full Code Family Communication: Updated patient's son, Diane Hua via telephone this morning.  Disposition Plan:  Level of care: Med-Surg Status is: Inpatient  Remains inpatient appropriate because:Unsafe d/c plan, IV treatments appropriate due to intensity of illness or inability to take PO, and Inpatient level of care appropriate due to severity of illness  Dispo: The patient is from: Home              Anticipated d/c is to:  To be determined              Patient currently is not medically stable to d/c.   Difficult to place patient No  Consultants:  Neurosurgery  Procedures:  None  Antimicrobials:  None   Subjective: Patient seen examined at bedside, resting comfortably.  Want something to drink, dry mouth.  Nursing requesting update on use of cervical collar, per neurosurgery this morning, will need to maintain in place indefinitely.  No other questions  or concerns at this time.  Patient denies headache, no fever/chills/night sweats, no nausea/vomiting/diarrhea, no chest pain, no palpitations, no shortness of breath, no abdominal pain.  No acute events overnight per nursing staff.  Objective: Vitals:   07/13/20 2226 07/14/20 0425 07/14/20 0729 07/14/20 0930  BP: 122/61 (!) 130/55 140/63 (!) 145/62  Pulse: 79 74 76 80  Resp: Temp: 98.9 F (37.2 C) 98.7 F (37.1 C) 98.1 F (36.7 C)   TempSrc: Oral Oral Oral   SpO2: 98% 99% 96%   Weight:      Height:        Intake/Output Summary (Last 24 hours) at 07/14/2020 1033 Last data filed at 07/14/2020 0600 Gross per 24 hour  Intake 141.03 ml  Output --  Net 141.03 ml   Filed Weights   07/13/20 1554  Weight: 49.9 kg    Examination:  General exam: Appears calm and comfortable, elderly in appearance, Aspen collar noted in place Respiratory system: Clear to auscultation. Respiratory effort normal.  On room air Cardiovascular system: S1 & S2 heard, RRR. No JVD, murmurs, rubs, gallops or clicks. No pedal edema. Gastrointestinal system: Abdomen is nondistended, soft and nontender. No organomegaly or masses felt. Normal bowel sounds heard. Central nervous system: Alert and oriented. No focal neurological deficits. Extremities: Symmetric 5 x 5 power. Skin: No rashes, lesions or ulcers Psychiatry: Judgement and insight appear normal. Mood & affect appropriate.     Data Reviewed: I have personally reviewed following labs and imaging studies  CBC: Recent Labs  Lab 07/13/20 1607  WBC 9.9  NEUTROABS 8.4*  HGB 12.2  HCT 37.8  MCV 96.2  PLT 217   Basic Metabolic Panel: Recent Labs  Lab 07/13/20 1636  NA 136  K 3.8  CL 105  CO2 23  GLUCOSE 126*  BUN 30*  CREATININE 1.69*  CALCIUM 8.6*   GFR: Estimated Creatinine Clearance: 16.3 mL/min (A) (by C-G formula based on SCr of 1.69 mg/dL (H)). Liver Function Tests: Recent Labs  Lab 07/13/20 1636  AST 19  ALT 8   ALKPHOS 110  BILITOT 1.7*  PROT 6.6  ALBUMIN 3.4*   No results for input(s): LIPASE, AMYLASE in the last 168 hours. No results for input(s): AMMONIA in the last 168 hours. Coagulation Profile: No results for input(s): INR, PROTIME in the last 168 hours. Cardiac Enzymes: Recent Labs  Lab 07/13/20 1636  CKTOTAL 192   BNP (last 3 results) No results for input(s): PROBNP in the last 8760 hours. HbA1C: No results for input(s): HGBA1C in the last 72 hours. CBG: Recent Labs  Lab 07/13/20 2231  GLUCAP 112*   Lipid Profile: No results for input(s): CHOL, HDL, LDLCALC, TRIG, CHOLHDL, LDLDIRECT in the last 72 hours. Thyroid Function Tests: No results for input(s): TSH, T4TOTAL, FREET4, T3FREE, THYROIDAB in the last 72 hours. Anemia Panel: No results for input(s): VITAMINB12, FOLATE, FERRITIN, TIBC, IRON, RETICCTPCT in the last 72 hours. Sepsis Labs: No results for input(s): PROCALCITON, LATICACIDVEN in the last 168 hours.  Recent Results (from the past 240 hour(s))  Resp Panel by RT-PCR (Flu A&B, Covid) Nasopharyngeal Swab     Status: None   Collection Time: 07/13/20  5:46 PM   Specimen: Nasopharyngeal Swab;  Nasopharyngeal(NP) swabs in vial transport medium  Result Value Ref Range Status   SARS Coronavirus 2 by RT PCR NEGATIVE NEGATIVE Final    Comment: (NOTE) SARS-CoV-2 target nucleic acids are NOT DETECTED.  The SARS-CoV-2 RNA is generally detectable in upper respiratory specimens during the acute phase of infection. The lowest concentration of SARS-CoV-2 viral copies this assay can detect is 138 copies/mL. A negative result does not preclude SARS-Cov-2 infection and should not be used as the sole basis for treatment or other patient management decisions. A negative result may occur with  improper specimen collection/handling, submission of specimen other than nasopharyngeal swab, presence of viral mutation(s) within the areas targeted by this assay, and inadequate number  of viral copies(<138 copies/mL). A negative result must be combined with clinical observations, patient history, and epidemiological information. The expected result is Negative.  Fact Sheet for Patients:  BloggerCourse.com  Fact Sheet for Healthcare Providers:  SeriousBroker.it  This test is no t yet approved or cleared by the [place] FDA and  has been authorized for detection and/or diagnosis of SARS-CoV-2 by FDA under an Emergency Use Authorization (EUA). This EUA will remain  in effect (meaning this test can be used) for the duration of the COVID-19 declaration under Section 564(b)(1) of the Act, 21 U.S.C.section 360bbb-3(b)(1), unless the authorization is terminated  or revoked sooner.       Influenza A by PCR NEGATIVE NEGATIVE Final   Influenza B by PCR NEGATIVE NEGATIVE Final    Comment: (NOTE) The Xpert Xpress SARS-CoV-2/FLU/RSV plus assay is intended as an aid in the diagnosis of influenza from Nasopharyngeal swab specimens and should not be used as a sole basis for treatment. Nasal washings and aspirates are unacceptable for Xpert Xpress SARS-CoV-2/FLU/RSV testing.  Fact Sheet for Patients: BloggerCourse.com  Fact Sheet for Healthcare Providers: SeriousBroker.it  This test is not yet approved or cleared by the [place] FDA and has been authorized for detection and/or diagnosis of SARS-CoV-2 by FDA under an Emergency Use Authorization (EUA). This EUA will remain in effect (meaning this test can be used) for the duration of the COVID-19 declaration under Section 564(b)(1) of the Act, 21 U.S.C. section 360bbb-3(b)(1), unless the authorization is terminated or revoked.  Performed at [facility], [address] [place], [place] [address]          Radiology Studies: DG Chest 1 View  Result Date: 07/13/2020 CLINICAL DATA:  Pain  following fall EXAM: CHEST  1 VIEW COMPARISON:  January 04, 2016 FINDINGS: Lungs are clear. Heart size and pulmonary vascularity are normal. There is aortic atherosclerosis. No adenopathy. Degenerative change noted in each shoulder. No pneumothorax. IMPRESSION: Lungs clear. Heart size normal. Aortic Atherosclerosis (ICD10-I70.0). Electronically Signed   By: Bretta Bang III M.D.   On: 07/13/2020 16:46   DG Pelvis 1-2 Views  Result Date: 07/14/2020 CLINICAL DATA:  Leg weakness EXAM: PELVIS - 1-2 VIEW COMPARISON:  None. FINDINGS: Diffuse osteopenia. No acute bony abnormality. Specifically, no fracture, subluxation, or dislocation. IMPRESSION: No acute bony abnormality. Electronically Signed   By: Charlett Nose M.D.   On: 07/14/2020 00:04   DG Knee 1-2 Views Left  Result Date: 07/14/2020 CLINICAL DATA:  Fall, left knee pain EXAM: LEFT KNEE - 1-2 VIEW COMPARISON:  08/21/2017 FINDINGS: Two view radiograph of the left knee demonstrates normal alignment. No acute fracture or dislocation. Severe tricompartmental degenerative arthritis of the left knee, progressive since prior examination. Degenerative chondrocalcinosis noted involving the medial and lateral menisci.  Probable calcific suprapatellar bursitis present. No effusion. The soft tissues are otherwise unremarkable. IMPRESSION: Severe tricompartmental degenerative arthritis. No acute fracture or dislocation. Electronically Signed   By: Helyn Numbers MD   On: 07/14/2020 00:05   DG Knee 1-2 Views Right  Result Date: 07/14/2020 CLINICAL DATA: Leg weakness EXAM: RIGHT KNEE - 1-2 VIEW COMPARISON:  None. FINDINGS: Advanced tricompartment degenerative changes with joint space narrowing and spurring. No joint effusion. IMPRESSION: Advanced degenerative changes.  No acute bony abnormality. Electronically Signed   By: Charlett Nose M.D.   On: 07/14/2020 00:03   CT Head Wo Contrast  Result Date: 07/13/2020 CLINICAL DATA:  Recent fall with headaches and neck  pain, initial encounter EXAM: CT HEAD WITHOUT CONTRAST CT CERVICAL SPINE WITHOUT CONTRAST TECHNIQUE: Multidetector CT imaging of the head and cervical spine was performed following the standard protocol without intravenous contrast. Multiplanar CT image reconstructions of the cervical spine were also generated. COMPARISON:  None. FINDINGS: CT HEAD FINDINGS Brain: No evidence of acute infarction, hemorrhage, hydrocephalus, extra-axial collection or mass lesion/mass effect. Mild atrophic changes are noted. Vascular: No hyperdense vessel or unexpected calcification. Skull: Normal. Negative for fracture or focal lesion. Sinuses/Orbits: No acute finding. Other: None. CT CERVICAL SPINE FINDINGS Alignment: Within normal limits with the exception of the odontoid Skull base and vertebrae: There is a fracture at the base of the odontoid with the proximally 1.5 mm of posterior displacement of the odontoid with respect to the body of C2. No significant central canal stenosis is noted. Multilevel facet hypertrophic changes are noted. Disc space narrowing is noted from C3 to C6 with associated osteophytic changes. No other fractures are seen. Soft tissues and spinal canal: Surrounding soft tissue structures are within normal limits. Upper chest: Visualized lung apices are within normal limits. Other: None IMPRESSION: CT of the head: No acute intracranial abnormality noted. CT of the cervical spine: Fracture through the base of the odontoid with only minimal posterior displacement with respect to the body of C2. No other focal fracture is seen. Multilevel degenerative change. Critical Value/emergent results were called by telephone at the time of interpretation on 07/13/2020 at 4:50 pm to Dr. Tilden Fossa , who verbally acknowledged these results. Electronically Signed   By: Alcide Clever M.D.   On: 07/13/2020 16:51   CT Cervical Spine Wo Contrast  Result Date: 07/13/2020 CLINICAL DATA:  Recent fall with headaches and neck  pain, initial encounter EXAM: CT HEAD WITHOUT CONTRAST CT CERVICAL SPINE WITHOUT CONTRAST TECHNIQUE: Multidetector CT imaging of the head and cervical spine was performed following the standard protocol without intravenous contrast. Multiplanar CT image reconstructions of the cervical spine were also generated. COMPARISON:  None. FINDINGS: CT HEAD FINDINGS Brain: No evidence of acute infarction, hemorrhage, hydrocephalus, extra-axial collection or mass lesion/mass effect. Mild atrophic changes are noted. Vascular: No hyperdense vessel or unexpected calcification. Skull: Normal. Negative for fracture or focal lesion. Sinuses/Orbits: No acute finding. Other: None. CT CERVICAL SPINE FINDINGS Alignment: Within normal limits with the exception of the odontoid Skull base and vertebrae: There is a fracture at the base of the odontoid with the proximally 1.5 mm of posterior displacement of the odontoid with respect to the body of C2. No significant central canal stenosis is noted. Multilevel facet hypertrophic changes are noted. Disc space narrowing is noted from C3 to C6 with associated osteophytic changes. No other fractures are seen. Soft tissues and spinal canal: Surrounding soft tissue structures are within normal limits. Upper chest: Visualized lung apices are  within normal limits. Other: None IMPRESSION: CT of the head: No acute intracranial abnormality noted. CT of the cervical spine: Fracture through the base of the odontoid with only minimal posterior displacement with respect to the body of C2. No other focal fracture is seen. Multilevel degenerative change. Critical Value/emergent results were called by telephone at the time of interpretation on 07/13/2020 at 4:50 pm to Dr. Tilden FossaELIZABETH REES , who verbally acknowledged these results. Electronically Signed   By: Alcide CleverMark  Lukens M.D.   On: 07/13/2020 16:51        Scheduled Meds:  LORazepam  1 mg Oral QHS   metoprolol succinate  25 mg Oral Daily   simvastatin  20  mg Oral Daily   traMADol  50 mg Oral BID   Continuous Infusions:  lactated ringers Stopped (07/14/20 0243)     LOS: 1 day    Time spent: 39 minutes spent on chart review, discussion with nursing staff, consultants, updating family and interview/physical exam; more than 50% of that time was spent in counseling and/or coordination of care.    Alvira PhilipsEric J [place], DO [facility] Available via Epic secure chat 7am-7pm After these hours, please refer to coverage provider listed on [email or web address] 07/14/2020, 10:33 AM

## 2020-07-14 NOTE — Progress Notes (Signed)
MD notified that patient had an abnormal amount of very thick yellow/tan in color horribly smelling discharge from her vagina. MD order for antifungal medication for possible yeast infection. Further during the day noticed patient urine output had been very minimal. Patient bladder scan performed and MD notified that patient had 650 in bladder. Order for I & O obtained. I & O performed and 710 mL were obtained.  Will continue to assess and hand off to ongoing nurse about retention issues.

## 2020-07-14 NOTE — TOC CAGE-AID Note (Signed)
Transition of Care [facility]) - CAGE-AID Screening   Patient Details  Name: Diane Nolan MRN: [id number] Date of Birth: [birth year]  Transition of Care (TOC) CM/SW Contact:    Katha Hamming, RN Phone Number:[phone] 07/14/2020, 8:50 PM   CAGE-AID Screening: Substance Abuse Screening unable to be completed due to: : Patient unable to participate (patient confused)

## 2020-07-14 NOTE — Consult Note (Signed)
Providing Compassionate, Quality Care - Together   Reason for Consult: Type 2 odontoid fracture Referring Physician: Dr. Gabriel Rainwater is an 87 y.o. female.  HPI: Ms. Swartzlander is a 87 year old female who presented to the [facility] emergency department following multiple falls at home. She has a medical history significant for DM, HLD, HTN, and cholecystectomy. Her sound found her on the ground when he came to pick her up for a doctor's appointment. Her son reported to the ED provider that Ms. Krenzer seemed confused compared to her baseline. She lives at home independently. She reports head and neck pain. She denies numbness, tingling, or weakness of her upper and lower extremities. She denies bowel or bladder dysfunction. She is in a hard cervical collar and her neck has been immobilized. The CT of her head performed in the [facility] ED was negative. The CT of her cervical spine revealed fracture through the base of the odontoid with only minimal posterior displacement with respect to the body of C2. Neurosurgery was consulted for further evaluation and recommendations.  Past Medical History:  Diagnosis Date   Diabetes mellitus    HLD (hyperlipidemia)    Hypertension    Intracervical pessary    in place-dr henley manages    Past Surgical History:  Procedure Laterality Date   CHILD BIRTH  X1   CHOLECYSTECTOMY  06/01/2011   Procedure: LAPAROSCOPIC CHOLECYSTECTOMY WITH INTRAOPERATIVE CHOLANGIOGRAM;  Surgeon: Clovis Pu. Cornett, MD;  Location:  SURGERY CENTER;  Service: General;  Laterality: N/A;  Laparoscopic cholecystectomy with cholangiogram    Family History  Problem Relation Age of Onset   Other Mother        Old Age   Diabetes Father     Social History:  reports that she has never smoked. She has never used smokeless tobacco. She reports that she does not drink alcohol and does not use drugs.  Allergies: No Known Allergies  Medications: I have reviewed the patient's  current medications.  Results for orders placed or performed during the hospital encounter of 07/13/20 (from the past 48 hour(s))  CBC with Differential     Status: Abnormal   Collection Time: 07/13/20  4:07 PM  Result Value Ref Range   WBC 9.9 4.0 - 10.5 K/uL   RBC 3.93 3.87 - 5.11 MIL/uL   Hemoglobin 12.2 12.0 - 15.0 g/dL   HCT 40.9 81.1 - 91.4 %   MCV 96.2 80.0 - 100.0 fL   MCH 31.0 26.0 - 34.0 pg   MCHC 32.3 30.0 - 36.0 g/dL   RDW 78.2 95.6 - 21.3 %   Platelets 217 150 - 400 K/uL   nRBC 0.0 0.0 - 0.2 %   Neutrophils Relative % 84 %   Neutro Abs 8.4 (H) 1.7 - 7.7 K/uL   Lymphocytes Relative 9 %   Lymphs Abs 0.8 0.7 - 4.0 K/uL   Monocytes Relative 7 %   Monocytes Absolute 0.7 0.1 - 1.0 K/uL   Eosinophils Relative 0 %   Eosinophils Absolute 0.0 0.0 - 0.5 K/uL   Basophils Relative 0 %   Basophils Absolute 0.0 0.0 - 0.1 K/uL   Immature Granulocytes 0 %   Abs Immature Granulocytes 0.04 0.00 - 0.07 K/uL    Comment: Performed at [facility], [address], [place], [place] [address]  CK     Status: None   Collection Time: 07/13/20  4:36 PM  Result Value Ref Range   Total CK  192 38 - 234 U/L    Comment: Performed at [facility], [address] [place], [place] [address]  Comprehensive metabolic panel     Status: Abnormal   Collection Time: 07/13/20  4:36 PM  Result Value Ref Range   Sodium 136 135 - 145 mmol/L   Potassium 3.8 3.5 - 5.1 mmol/L   Chloride 105 98 - 111 mmol/L   CO2 23 22 - 32 mmol/L   Glucose, Bld 126 (H) 70 - 99 mg/dL    Comment: Glucose reference range applies only to samples taken after fasting for at least 8 hours.   BUN 30 (H) 8 - 23 mg/dL   Creatinine, Ser 6.041.69 (H) 0.44 - 1.00 mg/dL   Calcium 8.6 (L) 8.9 - 10.3 mg/dL   Total Protein 6.6 6.5 - 8.1 g/dL   Albumin 3.4 (L) 3.5 - 5.0 g/dL   AST 19 15 - 41 U/L   ALT 8 0 - 44 U/L   Alkaline Phosphatase 110 38 - 126 U/L   Total Bilirubin 1.7 (H) 0.3 - 1.2 mg/dL    GFR, Estimated 29 (L) >60 mL/min    Comment: (NOTE) Calculated using the CKD-EPI Creatinine Equation (2021)    Anion gap 8 5 - 15    Comment: Performed at [facility], [address] [place], [place] [address]  Resp Panel by RT-PCR (Flu A&B, Covid) Nasopharyngeal Swab     Status: None   Collection Time: 07/13/20  5:46 PM   Specimen: Nasopharyngeal Swab; Nasopharyngeal(NP) swabs in vial transport medium  Result Value Ref Range   SARS Coronavirus 2 by RT PCR NEGATIVE NEGATIVE    Comment: (NOTE) SARS-CoV-2 target nucleic acids are NOT DETECTED.  The SARS-CoV-2 RNA is generally detectable in upper respiratory specimens during the acute phase of infection. The lowest concentration of SARS-CoV-2 viral copies this assay can detect is 138 copies/mL. A negative result does not preclude SARS-Cov-2 infection and should not be used as the sole basis for treatment or other patient management decisions. A negative result may occur with  improper specimen collection/handling, submission of specimen other than nasopharyngeal swab, presence of viral mutation(s) within the areas targeted by this assay, and inadequate number of viral copies(<138 copies/mL). A negative result must be combined with clinical observations, patient history, and epidemiological information. The expected result is Negative.  Fact Sheet for Patients:  BloggerCourse.comhttps://www.fda.gov/media/152166/download  Fact Sheet for Healthcare Providers:  SeriousBroker.ithttps://www.fda.gov/media/152162/download  This test is no t yet approved or cleared by the [place] FDA and  has been authorized for detection and/or diagnosis of SARS-CoV-2 by FDA under an Emergency Use Authorization (EUA). This EUA will remain  in effect (meaning this test can be used) for the duration of the COVID-19 declaration under Section 564(b)(1) of the Act, 21 U.S.C.section 360bbb-3(b)(1), unless the authorization is terminated  or revoked sooner.        Influenza A by PCR NEGATIVE NEGATIVE   Influenza B by PCR NEGATIVE NEGATIVE    Comment: (NOTE) The Xpert Xpress SARS-CoV-2/FLU/RSV plus assay is intended as an aid in the diagnosis of influenza from Nasopharyngeal swab specimens and should not be used as a sole basis for treatment. Nasal washings and aspirates are unacceptable for Xpert Xpress SARS-CoV-2/FLU/RSV testing.  Fact Sheet for Patients: BloggerCourse.comhttps://www.fda.gov/media/152166/download  Fact Sheet for Healthcare Providers: SeriousBroker.ithttps://www.fda.gov/media/152162/download  This test is not yet approved or cleared by the [place] FDA and has been authorized for detection and/or diagnosis of SARS-CoV-2 by FDA under  an Emergency Use Authorization (EUA). This EUA will remain in effect (meaning this test can be used) for the duration of the COVID-19 declaration under Section 564(b)(1) of the Act, 21 U.S.C. section 360bbb-3(b)(1), unless the authorization is terminated or revoked.  Performed at [facility], [address], [place], [place] [address]   Glucose, capillary     Status: Abnormal   Collection Time: 07/13/20 10:31 PM  Result Value Ref Range   Glucose-Capillary 112 (H) 70 - 99 mg/dL    Comment: Glucose reference range applies only to samples taken after fasting for at least 8 hours.    DG Chest 1 View  Result Date: 07/13/2020 CLINICAL DATA:  Pain following fall EXAM: CHEST  1 VIEW COMPARISON:  January 04, 2016 FINDINGS: Lungs are clear. Heart size and pulmonary vascularity are normal. There is aortic atherosclerosis. No adenopathy. Degenerative change noted in each shoulder. No pneumothorax. IMPRESSION: Lungs clear. Heart size normal. Aortic Atherosclerosis (ICD10-I70.0). Electronically Signed   By: Bretta Bang III M.D.   On: 07/13/2020 16:46   DG Pelvis 1-2 Views  Result Date: 07/14/2020 CLINICAL DATA:  Leg weakness EXAM: PELVIS - 1-2 VIEW COMPARISON:  None. FINDINGS: Diffuse osteopenia. No acute  bony abnormality. Specifically, no fracture, subluxation, or dislocation. IMPRESSION: No acute bony abnormality. Electronically Signed   By: Charlett Nose M.D.   On: 07/14/2020 00:04   DG Knee 1-2 Views Left  Result Date: 07/14/2020 CLINICAL DATA:  Fall, left knee pain EXAM: LEFT KNEE - 1-2 VIEW COMPARISON:  08/21/2017 FINDINGS: Two view radiograph of the left knee demonstrates normal alignment. No acute fracture or dislocation. Severe tricompartmental degenerative arthritis of the left knee, progressive since prior examination. Degenerative chondrocalcinosis noted involving the medial and lateral menisci. Probable calcific suprapatellar bursitis present. No effusion. The soft tissues are otherwise unremarkable. IMPRESSION: Severe tricompartmental degenerative arthritis. No acute fracture or dislocation. Electronically Signed   By: Helyn Numbers MD   On: 07/14/2020 00:05   DG Knee 1-2 Views Right  Result Date: 07/14/2020 CLINICAL DATA: Leg weakness EXAM: RIGHT KNEE - 1-2 VIEW COMPARISON:  None. FINDINGS: Advanced tricompartment degenerative changes with joint space narrowing and spurring. No joint effusion. IMPRESSION: Advanced degenerative changes.  No acute bony abnormality. Electronically Signed   By: Charlett Nose M.D.   On: 07/14/2020 00:03   CT Head Wo Contrast  Result Date: 07/13/2020 CLINICAL DATA:  Recent fall with headaches and neck pain, initial encounter EXAM: CT HEAD WITHOUT CONTRAST CT CERVICAL SPINE WITHOUT CONTRAST TECHNIQUE: Multidetector CT imaging of the head and cervical spine was performed following the standard protocol without intravenous contrast. Multiplanar CT image reconstructions of the cervical spine were also generated. COMPARISON:  None. FINDINGS: CT HEAD FINDINGS Brain: No evidence of acute infarction, hemorrhage, hydrocephalus, extra-axial collection or mass lesion/mass effect. Mild atrophic changes are noted. Vascular: No hyperdense vessel or unexpected calcification.  Skull: Normal. Negative for fracture or focal lesion. Sinuses/Orbits: No acute finding. Other: None. CT CERVICAL SPINE FINDINGS Alignment: Within normal limits with the exception of the odontoid Skull base and vertebrae: There is a fracture at the base of the odontoid with the proximally 1.5 mm of posterior displacement of the odontoid with respect to the body of C2. No significant central canal stenosis is noted. Multilevel facet hypertrophic changes are noted. Disc space narrowing is noted from C3 to C6 with associated osteophytic changes. No other fractures are seen. Soft tissues and spinal canal: Surrounding soft tissue structures are within normal limits. Upper  chest: Visualized lung apices are within normal limits. Other: None IMPRESSION: CT of the head: No acute intracranial abnormality noted. CT of the cervical spine: Fracture through the base of the odontoid with only minimal posterior displacement with respect to the body of C2. No other focal fracture is seen. Multilevel degenerative change. Critical Value/emergent results were called by telephone at the time of interpretation on 07/13/2020 at 4:50 pm to Dr. Tilden Fossa , who verbally acknowledged these results. Electronically Signed   By: Alcide Clever M.D.   On: 07/13/2020 16:51   CT Cervical Spine Wo Contrast  Result Date: 07/13/2020 CLINICAL DATA:  Recent fall with headaches and neck pain, initial encounter EXAM: CT HEAD WITHOUT CONTRAST CT CERVICAL SPINE WITHOUT CONTRAST TECHNIQUE: Multidetector CT imaging of the head and cervical spine was performed following the standard protocol without intravenous contrast. Multiplanar CT image reconstructions of the cervical spine were also generated. COMPARISON:  None. FINDINGS: CT HEAD FINDINGS Brain: No evidence of acute infarction, hemorrhage, hydrocephalus, extra-axial collection or mass lesion/mass effect. Mild atrophic changes are noted. Vascular: No hyperdense vessel or unexpected calcification.  Skull: Normal. Negative for fracture or focal lesion. Sinuses/Orbits: No acute finding. Other: None. CT CERVICAL SPINE FINDINGS Alignment: Within normal limits with the exception of the odontoid Skull base and vertebrae: There is a fracture at the base of the odontoid with the proximally 1.5 mm of posterior displacement of the odontoid with respect to the body of C2. No significant central canal stenosis is noted. Multilevel facet hypertrophic changes are noted. Disc space narrowing is noted from C3 to C6 with associated osteophytic changes. No other fractures are seen. Soft tissues and spinal canal: Surrounding soft tissue structures are within normal limits. Upper chest: Visualized lung apices are within normal limits. Other: None IMPRESSION: CT of the head: No acute intracranial abnormality noted. CT of the cervical spine: Fracture through the base of the odontoid with only minimal posterior displacement with respect to the body of C2. No other focal fracture is seen. Multilevel degenerative change. Critical Value/emergent results were called by telephone at the time of interpretation on 07/13/2020 at 4:50 pm to Dr. Tilden Fossa , who verbally acknowledged these results. Electronically Signed   By: Alcide Clever M.D.   On: 07/13/2020 16:51    Review of Systems  Constitutional: Negative.   HENT: Negative.    Eyes: Negative.   Respiratory: Negative.    Cardiovascular: Negative.   Gastrointestinal: Negative.   Genitourinary: Negative.   Musculoskeletal:  Positive for falls and neck pain.  Skin: Negative.   Neurological:  Positive for weakness. Negative for dizziness, tingling, tremors, sensory change, speech change, focal weakness, seizures, loss of consciousness and headaches.  Endo/Heme/Allergies: Negative.   Psychiatric/Behavioral: Negative.    Blood pressure (!) 145/62, pulse 80, temperature 98.1 F (36.7 C), temperature source Oral, resp. rate 15, height 5' 0.5" (1.537 m), weight 49.9 kg, SpO2  96 %. Estimated body mass index is 21.13 kg/m as calculated from the following:   Height as of this encounter: 5' 0.5" (1.537 m).   Weight as of this encounter: 49.9 kg.  Physical Exam Constitutional:      Appearance: Normal appearance.  HENT:     Head: Normocephalic.     Nose: Nose normal.     Mouth/Throat:     Mouth: Mucous membranes are moist.     Pharynx: Oropharynx is clear.  Eyes:     Extraocular Movements: Extraocular movements intact.     Pupils: Pupils are  equal, round, and reactive to light.  Neck:     Comments: Hard cervical collar in place Cardiovascular:     Rate and Rhythm: Normal rate and regular rhythm.     Pulses: Normal pulses.  Pulmonary:     Effort: Pulmonary effort is normal.  Abdominal:     General: Abdomen is flat.     Palpations: Abdomen is soft.  Musculoskeletal:     Cervical back: Tenderness present.     Comments: Neck immobilized Strength and sensation intact in BUE Was unable to get patient to raise legs off of the bed. Patient tells me she walks at home without difficulty. Plantar and dorsiflexion strength appear intact. Could not get patient to participate further.  Skin:    Capillary Refill: Capillary refill takes less than 2 seconds.  Neurological:     Mental Status: She is alert. She is confused.     GCS: GCS eye subscore is 4. GCS verbal subscore is 4. GCS motor subscore is 6.     Cranial Nerves: Cranial nerves are intact.     Sensory: Sensation is intact.     Motor: Weakness present.  Psychiatric:        Mood and Affect: Mood normal.        Cognition and Memory: Cognition is impaired. Memory is impaired.    Assessment/Plan: The patient has a type 2 odontoid fracture following a fall. There are no surgical interventions recommended at this time. The patient should wear her hard cervical collar at all times. The goal is for the odontoid fracture to heal with time. Therapies have been ordered. Increasing the patient's lower extremity  strength and improving her balance should be the focus. Neck should remain immobilized in the cervical collar during therapies. The patient will need to follow up with Dr. Lovell Sheehan as an outpatient in four weeks. A follow up lateral x-ray of the patient's c-spine will be performed at that time.  Val Eagle, DNP, AGNP-C Nurse Practitioner  [facility] [address], [place], [place] [address] P: [phone]    F: [phone]  07/14/2020, 11:46 AM

## 2020-07-15 ENCOUNTER — Inpatient Hospital Stay (HOSPITAL_COMMUNITY): Payer: Medicare Other

## 2020-07-15 DIAGNOSIS — G928 Other toxic encephalopathy: Secondary | ICD-10-CM

## 2020-07-15 DIAGNOSIS — S12111D Posterior displaced Type II dens fracture, subsequent encounter for fracture with routine healing: Secondary | ICD-10-CM

## 2020-07-15 LAB — BASIC METABOLIC PANEL
Anion gap: 7 (ref 5–15)
BUN: 25 mg/dL — ABNORMAL HIGH (ref 8–23)
CO2: 24 mmol/L (ref 22–32)
Calcium: 8.6 mg/dL — ABNORMAL LOW (ref 8.9–10.3)
Chloride: 102 mmol/L (ref 98–111)
Creatinine, Ser: 1.46 mg/dL — ABNORMAL HIGH (ref 0.44–1.00)
GFR, Estimated: 34 mL/min — ABNORMAL LOW (ref >60)
Glucose, Bld: 125 mg/dL — ABNORMAL HIGH (ref 70–99)
Potassium: 3.7 mmol/L (ref 3.5–5.1)
Sodium: 133 mmol/L — ABNORMAL LOW (ref 135–145)

## 2020-07-15 LAB — VITAMIN B12: Vitamin B-12: 158 pg/mL — ABNORMAL LOW (ref 180–914)

## 2020-07-15 LAB — TSH: TSH: 3.22 u[IU]/mL (ref 0.350–4.500)

## 2020-07-15 MED ORDER — ACETAMINOPHEN 650 MG RE SUPP: 650.0000 mg | Freq: Four times a day (QID) | RECTAL | Status: DC | PRN

## 2020-07-15 MED ORDER — ACETAMINOPHEN 325 MG PO TABS
650.0000 mg | ORAL_TABLET | Freq: Four times a day (QID) | ORAL | Status: DC | PRN
  Administered 2020-07-16: 650 mg via ORAL
  Filled 2020-07-15: qty 2

## 2020-07-15 NOTE — Plan of Care (Signed)

## 2020-07-15 NOTE — Evaluation (Signed)
Physical Therapy Evaluation Patient Details Name: Diane Nolan MRN: [id number] DOB: [birth year] Today's Date: 07/15/2020   History of Present Illness  Diane Nolan is a 87 year old female with past medical history significant for essential hypertension, type 2 diabetes mellitus, Bil knee OA who presented to [facility] via EMS after being found down at home following an unwitnessed fall.  Patient was found by her son who was coming over to pick her up for a doctor's appointment; for apparently recurrent falls.  CT C-spine with fracture through the base of the odontoid with minimal posterior displacement with respect to the body of C2.  Per NS eval non operative, wear c-collar AAT.  Clinical Impression  Patient presents with decreased mobility due to LE weakness and pain, decreased balance, decreased cognition, decreased safety awareness and poor activity tolerance.  She remains at high risk for falling and does not have 24 hour support available.  She will need SNF level rehab at d/c.  PT to continue to follow acutely.     Follow Up Recommendations SNF;Supervision/Assistance - 24 hour    Equipment Recommendations  None recommended by PT    Recommendations for Other Services       Precautions / Restrictions Precautions Precautions: Fall Required Braces or Orthoses: Cervical Brace Cervical Brace: Hard collar;At all times      Mobility  Bed Mobility Overal bed mobility: Needs Assistance Bed Mobility: Supine to Sit;Sit to Supine     Supine to sit: Supervision;HOB elevated Sit to supine: Mod assist   General bed mobility comments: already sitting trying to scoot out of bed around lower rail upon my entry, C-collar off and mitts on her hands; to supine attempted to show her sidelying for spinal protection, but still limited by cognition, assisted both legs into bed    Transfers Overall transfer level: Needs assistance Equipment used: Rolling walker (2 wheeled) Transfers: Sit to/from  Stand Sit to Stand: Min assist;Mod assist         General transfer comment: some lifting help and pt heavily reliant on UE's, cannot take step with R without UE support due to L knee pain  Ambulation/Gait Ambulation/Gait assistance: Min assist;Mod assist Gait Distance (Feet): 30 Feet Assistive device: Rolling walker (2 wheeled) Gait Pattern/deviations: Step-to pattern;Decreased stride length;Shuffle;Trunk flexed;Antalgic     General Gait Details: limited due to elevated HR 124 with ambulation, assist for walker management around corners, and to turn around, pt needs assist for balance/safety  Stairs            Wheelchair Mobility    Modified Rankin (Stroke Patients Only)       Balance Overall balance assessment: Needs assistance   Sitting balance-Leahy Scale: Fair     Standing balance support: Bilateral upper extremity supported Standing balance-Leahy Scale: Poor Standing balance comment: UE support needed for balance and min A                             Pertinent Vitals/Pain Pain Assessment: Faces Faces Pain Scale: Hurts little more Pain Location: neck and shoulders Pain Descriptors / Indicators: Aching;Guarding Pain Intervention(s): Monitored during session;Repositioned    Home Living Family/patient expects to be discharged to:: Skilled nursing facility                      Prior Function Level of Independence: Independent with assistive device(s)         Comments: lived alone and used RW previously, son  and daughter in law both work     Higher education careers adviser        Extremity/Trunk Assessment   Upper Extremity Assessment Upper Extremity Assessment: Defer to OT evaluation    Lower Extremity Assessment Lower Extremity Assessment: RLE deficits/detail;LLE deficits/detail RLE Deficits / Details: arthritic changes evident at knees, limited AROM due to strength issues in hips, AAROM WFL, strength hip flexion 3-/5, knee extension 3+/5,  ankle DF 3+/5 (strength testing may be limited by cognition) RLE Coordination: decreased gross motor LLE Deficits / Details: arthritic changes evident at knees, limited AROM due to strength issues in hips, AAROM WFL, strength hip flexion 3-/5, knee extension 3/5 (grimaces and lets go with testing due to pain), ankle DF 3+/5 (strength testing may be limited by cognition)    Cervical / Trunk Assessment Cervical / Trunk Assessment: Kyphotic  Communication   Communication: No difficulties  Cognition Arousal/Alertness: Awake/alert Behavior During Therapy: Restless Overall Cognitive Status: Impaired/Different from baseline Area of Impairment: Orientation;Memory;Following commands;Safety/judgement;Problem solving                 Orientation Level: Disoriented to;Time;Place   Memory: Decreased short-term memory Following Commands: Follows one step commands consistently;Follows multi-step commands inconsistently Safety/Judgement: Decreased awareness of deficits;Decreased awareness of safety   Problem Solving: Slow processing;Requires verbal cues General Comments: knows she is at hospital, but expecting to see her cousin and keeps calling for her, wants to wear her own clothes especially underwear      General Comments General comments (skin integrity, edema, etc.): applied c-collar and ill-fitting despite my best efforts, noted errythema at base of brace on her neck, RN aware.    Exercises     Assessment/Plan    PT Assessment Patient needs continued PT services  PT Problem List Decreased strength;Decreased mobility;Decreased safety awareness;Decreased knowledge of precautions;Decreased balance;Decreased activity tolerance;Decreased cognition;Decreased knowledge of use of DME;Cardiopulmonary status limiting activity       PT Treatment Interventions DME instruction;Therapeutic activities;Cognitive remediation;Gait training;Therapeutic exercise;Patient/family education;Balance  training;Functional mobility training    PT Goals (Current goals can be found in the Care Plan section)  Acute Rehab PT Goals Patient Stated Goal: to find her friend PT Goal Formulation: Patient unable to participate in goal setting Time For Goal Achievement: 07/29/20 Potential to Achieve Goals: Fair    Frequency Min 2X/week   Barriers to discharge        Co-evaluation               AM-PAC PT "6 Clicks" Mobility  Outcome Measure Help needed turning from your back to your side while in a flat bed without using bedrails?: A Lot Help needed moving from lying on your back to sitting on the side of a flat bed without using bedrails?: A Lot Help needed moving to and from a bed to a chair (including a wheelchair)?: A Lot Help needed standing up from a chair using your arms (e.g., wheelchair or bedside chair)?: A Lot Help needed to walk in hospital room?: A Lot Help needed climbing 3-5 steps with a railing? : Total 6 Click Score: 11    End of Session Equipment Utilized During Treatment: Cervical collar Activity Tolerance: Patient limited by pain;Treatment limited secondary to medical complications (Comment) (HR 124 with ambulation) Patient left: in bed;with call bell/phone within reach;with bed alarm set Nurse Communication: Mobility status PT Visit Diagnosis: Other abnormalities of gait and mobility (R26.89);Muscle weakness (generalized) (M62.81);History of falling (Z91.81)    Time: 1035-1105 PT Time Calculation (min) (ACUTE ONLY): 30  min   Charges:   PT Evaluation $PT Eval Moderate Complexity: 1 Mod PT Treatments $Gait Training: 8-22 mins        Sheran Lawless, PT Acute Rehabilitation Services Pager:[phone] [phone] 07/15/2020   Elray Mcgregor 07/15/2020, 11:18 AM

## 2020-07-15 NOTE — Consult Note (Signed)
Neurology Consultation  Reason for Consult: confusion. Referring Physician: Debby Bud., MD.   CC: Confusion, falls  History is obtained from: patient, chart.   HPI: Diane Nolan is a 87 y.o. female with a PMHx of multiple falls, HLD, HTN, DM II, and DJD. Patient presented to [facility] on 07/13/20 after being found down at home. Apparently, she had a presumed mechanical fall and she was on the floor when son came by to take her to a MD appointment. There is no documentation on chart as to how long she was down, but her CK is normal which would suggest she was not down for long. Son reported that patient seemed more confused per her normal. Her workup in ED showed a type II odontoid fracture of C2 body. Aspen collar ordered after NSU consult and she will f/up with NSU out patient.   On further workup she was found to have AKI and this is improving. Was febrile in the ED to 100.40f. CXR and UA were unremarkable but microscopic pending on UA. No other significant abnormalities found during workup.   RN today has not had patient before. Per overnight RN, she was confused at times. Today, she is waxing and waning in re: place.  On neurological team evaluation, when evaluated by the APP, patient was cooperative with the exam and appeared appropriate but on later attending rounds, she started talking in Arabic and even upon redirection and letting her know that no one on the care team spoke Arabic, she continued to talk in Arabic.  She would come back to speaking in [place] with tangential speech.  ROS: A robust ROS was performed and is negative except as noted in the HPI.   Past Medical History:  Diagnosis Date   Diabetes mellitus    HLD (hyperlipidemia)    Hypertension    Intracervical pessary    in place-dr henley manages    Family History  Problem Relation Age of Onset   Other Mother        Old Age   Diabetes Father     Social History:   reports that she has never smoked. She has never used  smokeless tobacco. She reports that she does not drink alcohol and does not use drugs.  Medications  Current Facility-Administered Medications:    acetaminophen (TYLENOL) tablet 650 mg, 650 mg, Oral, Q6H PRN **OR** acetaminophen (TYLENOL) suppository 650 mg, 650 mg, Rectal, Q6H PRN, Hongalgi, Maximino Greenland, MD   lactated ringers infusion, , Intravenous, Continuous, Hillary Bow, DO, Last Rate: 75 mL/hr at 07/15/20 0018, New Bag at 07/15/20 0018   LORazepam (ATIVAN) tablet 1 mg, 1 mg, Oral, QHS, Julian Reil, Jared M, DO, 1 mg at 07/14/20 2227   metoprolol succinate (TOPROL-XL) 24 hr tablet 25 mg, 25 mg, Oral, Daily, Julian Reil, Jared M, DO, 25 mg at 07/15/20 1127   ondansetron (ZOFRAN) tablet 4 mg, 4 mg, Oral, Q6H PRN **OR** ondansetron (ZOFRAN) injection 4 mg, 4 mg, Intravenous, Q6H PRN, Julian Reil, Jared M, DO   simvastatin (ZOCOR) tablet 20 mg, 20 mg, Oral, Daily, Julian Reil, Jared M, DO, 20 mg at 07/15/20 1127   traMADol (ULTRAM) tablet 50 mg, 50 mg, Oral, BID, Julian Reil, Jared M, DO, 50 mg at 07/15/20 1127  Exam: Current vital signs: BP (!) 148/74 (BP Location: Right Arm)   Pulse 80   Temp 98.1 F (36.7 C) (Oral)   Resp 19   Ht 5' 0.5" (1.537 m)   Wt 49.9 kg   SpO2 100%  BMI 21.13 kg/m  Vital signs in last 24 hours: Temp:  [97.6 F (36.4 C)-98.3 F (36.8 C)] 98.1 F (36.7 C) (06/29 1622) Pulse Rate:  [71-88] 80 (06/29 1622) Resp:  [12-19] 19 (06/29 1622) BP: (130-154)/(70-76) 148/74 (06/29 1622) SpO2:  [99 %-100 %] 100 % (06/29 1622)  PE: GENERAL: Fairly well appearing elderly female who appears younger than stated age. Awake, alert in NAD. HEENT: - Normocephalic and atraumatic. Hard C collar in place.  LUNGS - Normal respiratory effort.  CV - RRR. ABDOMEN - Soft, nontender. Ext: warm, well perfused. Psych: affect light, calm, cooperative.  NEURO:  Mental Status: Alert and oriented to all with exception of place. She thinks she is at home. Aware of all else, even the president. She  says she lives with her husband and son, when her husband is deceased.  Speech/Language: speech is without dysarthria or aphasia.  Naming, repetition, fluency, and comprehension intact.  Cranial Nerves:  II: PERRL 39mm/brisk. visual fields full.  III, IV, VI: EOMI. Lid elevation symmetric and full.  V: sensation is intact and symmetrical to face.  VII: Smile is symmetrical. .  VIII:hearing intact to voice IX, X: palate elevation is symmetric. Phonation normal.  XI: normal sternocleidomastoid and trapezius muscle strength. NWG:NFAOZH is symmetrical without fasciculations.   Motor: 5/5 strength to UEs, bilateral plantar/dorsiflexion. Bilateral thighs 4-/5. Bilateral knees 4/5. She is able to lift both LEs off bed against gravity given enough time.  Tone is normal. Bulk is normal.  Sensation- Intact to light touch bilaterally in all four extremities. Extinction absent to light touch to DSS.  Coordination: FTN intact bilaterally. Can not perform HKS. No pronator drift.  DTRs: UE 1+   LE 0.  Gait- deferred  --------------------- Attending addendum On my examination, she was awake alert oriented to self, thinks she is at home but was able to follow all commands. She told us she lives with her son and her husband-when in reality her husband is deceased. She did have tangential speech and all of a sudden started speaking in Arabic and even upon redirection did not revert back to speaking in [place].  After having said a few sentences, she started saying some words in English and then reverted back to the form language. There was no dysarthria No evidence of aphasia Cranial nerves intact-2-12 Motor: Strength in upper extremities 5/5, lower extremities somewhat limited exam due to pain but at least antigravity 4-4+/5 bilaterally. Sensation intact to light touch --------------------------  NIHSS:  1a Level of Consciousness: 0 1b LOC Questions: 0 1c LOC Commands: 0 2 Best Gaze: 0 3 Visual:  0 4 Facial Palsy: 0 5a Motor Arm - left: 0 5b Motor Arm - Right: 0 6a Motor Leg - Left: 1 6b Motor Leg - Right: 1 7 Limb Ataxia: 0 8 Sensory: 0 9 Best Language: 0 10 Dysarthria: 0 11 Extinction and Inattention: 0 TOTAL: 2  Labs I have reviewed labs in epic and the results pertinent to this consultation are:  CBC    Component Value Date/Time   WBC 9.9 07/13/2020 1607   RBC 3.93 07/13/2020 1607   HGB 12.2 07/13/2020 1607   HCT 37.8 07/13/2020 1607   PLT 217 07/13/2020 1607   MCV 96.2 07/13/2020 1607   MCH 31.0 07/13/2020 1607   MCHC 32.3 07/13/2020 1607   RDW 13.0 07/13/2020 1607   LYMPHSABS 0.8 07/13/2020 1607   MONOABS 0.7 07/13/2020 1607   EOSABS 0.0 07/13/2020 1607   BASOSABS 0.0 07/13/2020  1607    CMP     Component Value Date/Time   NA 133 (L) 07/15/2020 0250   K 3.7 07/15/2020 0250   CL 102 07/15/2020 0250   CO2 24 07/15/2020 0250   GLUCOSE 125 (H) 07/15/2020 0250   BUN 25 (H) 07/15/2020 0250   CREATININE 1.46 (H) 07/15/2020 0250   CALCIUM 8.6 (L) 07/15/2020 0250   PROT 6.6 07/13/2020 1636   ALBUMIN 3.4 (L) 07/13/2020 1636   AST 19 07/13/2020 1636   ALT 8 07/13/2020 1636   ALKPHOS 110 07/13/2020 1636   BILITOT 1.7 (H) 07/13/2020 1636   GFRNONAA 34 (L) 07/15/2020 0250   GFRAA >60 01/04/2016 0217   Imaging MD reviewed the images obtained  CT head No acute abnormality.  CT of the C-spine on admission showed fracture through the base of the odontoid with only minimal posterior displacement with respect to the body of the C2.  No other focal fractures seen.  Multilevel degenerative disc disease seen.  Assessment: 87 yo female who presented 2 days ago after an unwitnessed fall and found down with confusion by son. Per notes, it would appear her confusion has improved over 2 day period. NP believes her confusion/encephalopathy is multifactorial, including fall, found down, AKI, metabolic derangements, delirium, and possible underlying cognitive impairment,  which has not been clear to the family but might have been unmasked in the setting of acute hospitalization.  However, given unwitnessed fall with resultant acute confusion, it is reasonable to r/o CVA and seizure as a cause for the fall.  Preliminary work-up for reversible causes of memory loss is also reasonable. It is reasonable as well, to have patient f/up with out patient neurology for formal cognitive testing and treatment if necessary.  As UTIs are often the cause of falls and encephalopathy in the elderly, suggest microscopy added to UA and perhaps culture.  Her Xanax 1mg  at hs is certainly not a good medicine for an elderly woman with frequent falls as it is on the Beers list of medications to avoid in the elderly and may be contributing to her falls.   Impression: -multifactorial metabolic/toxic encephalopathy.  -unwitnessed fall, r/o seizure and stroke.   Recommendations/Plan:  -Agree with microscopic urine with low threshold for culture.  -Agree with TSH, Vitamin B12.  -replete Vit B12 if result < 500.  -continue to correct metabolic derangements as you are doing.  -check rEEG.  -check MRI brain without contrast.  -Continue delirium precautions.  Communicate clearly and address sensory impairment. Minimize the patient's confusion. Lights on and blind's open during the day.  Lights off and blind's closed at night.  Minimize sleep interruption.  Encourage mobility and self-care. Optimize nutrition, hydration and regular continence. Minimize risk of injury and agitation. Minimize use of antipsychotic medications. -Avoid Beers list medications. .  -Would taper and stop Xanax and try Melatonin at hs.  -suggest outpatient neurology appointment for formal neuro/psych/cognitive testing.   Pt seen by [email or web address]/, NP/Neuro and later by MD. Note/plan to be edited by MD as needed.  Pager: Jimmye Norman  Attending Neurohospitalist Addendum Patient seen  and examined with APP/Resident. Agree with the history and physical as documented above. Agree with the plan as documented, which I helped formulate. I have independently reviewed the chart, obtained history, review of systems and examined the patient.I have personally reviewed pertinent head/neck/spine imaging (CT/MRI). CT head: No acute abnormality. CT of the C-spine on admission showed fracture through the base of the odontoid with only minimal posterior  displacement with respect to the body of the C2.  No other focal fractures seen.  Multilevel degenerative disc disease seen. Plan discussed with Dr. Waymon AmatoHongalgi We will follow. Please feel free to call with any questions.  -- Milon DikesAshish Makenleigh Crownover, MD Neurologist [facility] Pager: [phone]

## 2020-07-15 NOTE — Progress Notes (Signed)
Pt removed IV on night shift per night RN. Assessed veins and due to difficulty fining IV team was consulted.

## 2020-07-15 NOTE — Progress Notes (Addendum)
PROGRESS NOTE    Diane Nolan  AVW:[id number] DOB: 07/29/1928 DOA: 07/13/2020 PCP: Burton Apley, MD    Brief Narrative:  Diane Nolan is a 87 year old female with past medical history significant for essential hypertension, type 2 diabetes mellitus who presented to [facility] via EMS after being found down at home following an unwitnessed fall.  Patient was found by her son who was coming over to pick her up for a doctor's appointment; for apparently recurrent falls.  In the ED, her 98.4 F, HR 84, RR 16, BP 118/64, SPO2 100% on room air.  WBC 9.9, hemoglobin 12.2, platelets 217.  Sodium 136, potassium 3.8, chloride 105, CO2 23, glucose 126, BUN 30, creatinine 1.69.  AST 19, ALT 8, total bilirubin 1.7.  COVID-19 PCR negative.  Influenza A/B PCR negative.  CT head with no acute intercranial abnormality.  CT C-spine with fracture through the base of the odontoid with minimal posterior displacement with respect to the body of C2, no other focal fracture noted.  Chest x-ray with no acute cardiopulmonary disease process, noted aortic atherosclerosis.  Left knee x-ray with severe tricompartmental degenerative arthritis, no acute fracture/dislocation.  Right knee x-ray with advanced degenerative changes, no acute bony abnormality.  Pelvis x-ray, no acute bony abnormality.  Neurosurgery consulted.  TRH consulted for further evaluation and management of acute C2 fracture with associated recurrent falls and acute renal failure.  Patient was transferred to [facility] for neurosurgical evaluation.  Neurosurgery recommends no surgical intervention for type II odontoid fracture, should wear hard collar at all times, outpatient follow-up with Dr. Lovell Sheehan in 4 weeks.  At family's request, Neurology consulted on 6/29 for evaluation of confusion.   Assessment & Plan:   Principal Problem:   Odontoid fracture with type II morphology and posterior displacement (HCC) Active Problems:   Renal insufficiency   Acute  encephalopathy   Odontoid fracture with type II morphology and posterior displacement Presented from home after being found down for unknown time by son. Patient with history of recurrent falls.  CT head unrevealing and CT C-spine with odontoid fracture with minimal posterior displacement. --Neurosurgery evaluation/recommendations: Seen on 6/28.  Indicates that she has a type II odontoid fracture following a fall for which no surgical intervention is recommended, should wear hard cervical collar at all times and goal is for the fracture to heal with time, outpatient follow-up with Dr. Lovell Sheehan in 4 weeks at which time a lateral x-ray of the C-spine will be performed. -Therapies recommended SNF, TOC on board, SNF search ongoing. -Pain appears to be controlled on twice daily tramadol and has not required much of as needed meds.  Tramadol is her home medication and less likely to be the cause of her mental status changes.  Acute renal failure - Creatinine 1.69 at time of admission.  Baseline 0.89 from 01/04/2016 and no recent lab work since, even in [employer].  Etiology likely secondary to prerenal azotemia from dehydration after being found down.  CK1 92. - Creatinine has improved from 1.69-1.46.  Continue gentle IV fluids and follow BMP in AM.  It is certainly possible that she has developed some degree of chronic kidney disease since her lab work in 2017 with unknown but elevated baseline creatinine.  Acute metabolic encephalopathy Likely due to acute illness, hospitalization, pain, pain meds, AKI, which could be contributing to undiagnosed age-related cognitive impairment/dementia.  Delirium cautions.  Minimize sedatives and opioids.  Tele sitter ordered for safety.  Treat AKI.  Family/son wishes to have  a "complete neurology work-up" and neurology consultation which has been requested. Check TSH, B12 and urine microscopy to R/O UTI (RN noted fall smelling vaginal discharge on 6/28 for which she  received a dose of Diflucan).  Essential hypertension Controlled on Toprol-XL 25 Mg daily.  HLD:  Simvastatin 20 mg p.o. daily  Anxiety:  Lorazepam 1 mg p.o. qHS which is her chronic home medication.  Advanced degenerative joint disease Severe tricompartmental disease noted on left knee with advanced degenerative disease noted on right.  On tramadol 50 mg p.o. twice daily at home. --Continue Tylenol, home tramadol, as needed Norco  Acute urinary retention: As per RN, s/p In & out catheterization x2 but only 300 mL urine output the last time.  Recommend mobilization and hopefully she will void by herself without need for further catheterization.  Do not suspect that this is related to her cervical spine Fracture.   DVT prophylaxis: SCDs Start: 07/13/20 2003   Code Status: Full Code Family Communication: Discussed in detail with patient's son who is a Public librarian and climax, [place].  Updated care and answered all questions.  Remains inpatient appropriate because:Unsafe d/c plan, IV treatments appropriate due to intensity of illness or inability to take PO, and Inpatient level of care appropriate due to severity of illness  Dispo: The patient is from: Home              Anticipated d/c is to: SNF              Patient currently is not medically stable to d/c.   Difficult to place patient No  Consultants:  Neurosurgery Neurology  Procedures:  None  Antimicrobials:  None   Subjective: Patient interviewed and examined along with her RN in the room.  RN reported that patient is somewhat confused, attempting to get out of bed, high fall risk, pulling out IVs etc. and is requesting restraints or sitter.  Patient asking if she can go home.  Tells me that she was originally from [place] and then moved to several countries prior to coming to the [place].  She indicates that she lives alone.  No pain reported.  Keeps insisting on going home.  Objective: Vitals:   07/15/20 0011 07/15/20 0432  07/15/20 0902 07/15/20 1229  BP:  (!) 154/76 130/70 130/72  Pulse:  71 75 88  Resp:   17 18  Temp: 97.7 F (36.5 C) 97.8 F (36.6 C) 98.3 F (36.8 C) 97.6 F (36.4 C)  TempSrc: Oral Oral Oral Oral  SpO2: 100% 100% 100% 100%  Weight:      Height:        Intake/Output Summary (Last 24 hours) at 07/15/2020 1539 Last data filed at 07/15/2020 0215 Gross per 24 hour  Intake 200 ml  Output 1435 ml  Net -1235 ml   Filed Weights   07/13/20 1554  Weight: 49.9 kg    Examination:  General exam: Pleasant elderly female, moderately built and frail, lying comfortably propped up in bed without distress. Neck: Has hard cervical collar in place. Respiratory system: Clear to auscultation.  No increased work of breathing. Cardiovascular system: S1 and S2 heard, RRR.  No JVD, murmurs or pedal edema.  Telemetry personally reviewed: Sinus rhythm. Gastrointestinal system: Abdomen is nondistended, soft and nontender. No organomegaly or masses felt. Normal bowel sounds heard. Central nervous system: Alert and oriented to self and time and partly to place. No focal neurological deficits. Extremities: Symmetric 5 x 5 power. Skin: No rashes, lesions  or ulcers Psychiatry: Judgement and insight appear somewhat impaired.  Mood & affect appropriate.     Data Reviewed: I have personally reviewed following labs and imaging studies  CBC: Recent Labs  Lab 07/13/20 1607  WBC 9.9  NEUTROABS 8.4*  HGB 12.2  HCT 37.8  MCV 96.2  PLT 217   Basic Metabolic Panel: Recent Labs  Lab 07/13/20 1636 07/15/20 0250  NA 136 133*  K 3.8 3.7  CL 105 102  CO2 23 24  GLUCOSE 126* 125*  BUN 30* 25*  CREATININE 1.69* 1.46*  CALCIUM 8.6* 8.6*   GFR: Estimated Creatinine Clearance: 18.9 mL/min (A) (by C-G formula based on SCr of 1.46 mg/dL (H)). Liver Function Tests: Recent Labs  Lab 07/13/20 1636  AST 19  ALT 8  ALKPHOS 110  BILITOT 1.7*  PROT 6.6  ALBUMIN 3.4*    Cardiac Enzymes: Recent Labs   Lab 07/13/20 1636  CKTOTAL 192    CBG: Recent Labs  Lab 07/13/20 2231  GLUCAP 112*    Recent Results (from the past 240 hour(s))  Resp Panel by RT-PCR (Flu A&B, Covid) Nasopharyngeal Swab     Status: None   Collection Time: 07/13/20  5:46 PM   Specimen: Nasopharyngeal Swab; Nasopharyngeal(NP) swabs in vial transport medium  Result Value Ref Range Status   SARS Coronavirus 2 by RT PCR NEGATIVE NEGATIVE Final    Comment: (NOTE) SARS-CoV-2 target nucleic acids are NOT DETECTED.  The SARS-CoV-2 RNA is generally detectable in upper respiratory specimens during the acute phase of infection. The lowest concentration of SARS-CoV-2 viral copies this assay can detect is 138 copies/mL. A negative result does not preclude SARS-Cov-2 infection and should not be used as the sole basis for treatment or other patient management decisions. A negative result may occur with  improper specimen collection/handling, submission of specimen other than nasopharyngeal swab, presence of viral mutation(s) within the areas targeted by this assay, and inadequate number of viral copies(<138 copies/mL). A negative result must be combined with clinical observations, patient history, and epidemiological information. The expected result is Negative.  Fact Sheet for Patients:  BloggerCourse.com  Fact Sheet for Healthcare Providers:  SeriousBroker.it  This test is no t yet approved or cleared by the [place] FDA and  has been authorized for detection and/or diagnosis of SARS-CoV-2 by FDA under an Emergency Use Authorization (EUA). This EUA will remain  in effect (meaning this test can be used) for the duration of the COVID-19 declaration under Section 564(b)(1) of the Act, 21 U.S.C.section 360bbb-3(b)(1), unless the authorization is terminated  or revoked sooner.       Influenza A by PCR NEGATIVE NEGATIVE Final   Influenza B by PCR NEGATIVE  NEGATIVE Final    Comment: (NOTE) The Xpert Xpress SARS-CoV-2/FLU/RSV plus assay is intended as an aid in the diagnosis of influenza from Nasopharyngeal swab specimens and should not be used as a sole basis for treatment. Nasal washings and aspirates are unacceptable for Xpert Xpress SARS-CoV-2/FLU/RSV testing.  Fact Sheet for Patients: BloggerCourse.com  Fact Sheet for Healthcare Providers: SeriousBroker.it  This test is not yet approved or cleared by the [place] FDA and has been authorized for detection and/or diagnosis of SARS-CoV-2 by FDA under an Emergency Use Authorization (EUA). This EUA will remain in effect (meaning this test can be used) for the duration of the COVID-19 declaration under Section 564(b)(1) of the Act, 21 U.S.C. section 360bbb-3(b)(1), unless the authorization is terminated or revoked.  Performed at [facility], [address] [place], [place] [id number]          Radiology Studies: DG Chest 1 View  Result Date: 07/13/2020 CLINICAL DATA:  Pain following fall EXAM: CHEST  1 VIEW COMPARISON:  January 04, 2016 FINDINGS: Lungs are clear. Heart size and pulmonary vascularity are normal. There is aortic atherosclerosis. No adenopathy. Degenerative change noted in each shoulder. No pneumothorax. IMPRESSION: Lungs clear. Heart size normal. Aortic Atherosclerosis (ICD10-I70.0). Electronically Signed   By: Bretta BangWilliam  Woodruff III M.D.   On: 07/13/2020 16:46   DG Pelvis 1-2 Views  Result Date: 07/14/2020 CLINICAL DATA:  Leg weakness EXAM: PELVIS - 1-2 VIEW COMPARISON:  None. FINDINGS: Diffuse osteopenia. No acute bony abnormality. Specifically, no fracture, subluxation, or dislocation. IMPRESSION: No acute bony abnormality. Electronically Signed   By: Charlett NoseKevin  Dover M.D.   On: 07/14/2020 00:04   DG Knee 1-2 Views Left  Result Date: 07/14/2020 CLINICAL DATA:  Fall, left knee pain EXAM: LEFT  KNEE - 1-2 VIEW COMPARISON:  08/21/2017 FINDINGS: Two view radiograph of the left knee demonstrates normal alignment. No acute fracture or dislocation. Severe tricompartmental degenerative arthritis of the left knee, progressive since prior examination. Degenerative chondrocalcinosis noted involving the medial and lateral menisci. Probable calcific suprapatellar bursitis present. No effusion. The soft tissues are otherwise unremarkable. IMPRESSION: Severe tricompartmental degenerative arthritis. No acute fracture or dislocation. Electronically Signed   By: Helyn NumbersAshesh  Parikh MD   On: 07/14/2020 00:05   DG Knee 1-2 Views Right  Result Date: 07/14/2020 CLINICAL DATA: Leg weakness EXAM: RIGHT KNEE - 1-2 VIEW COMPARISON:  None. FINDINGS: Advanced tricompartment degenerative changes with joint space narrowing and spurring. No joint effusion. IMPRESSION: Advanced degenerative changes.  No acute bony abnormality. Electronically Signed   By: Charlett NoseKevin  Dover M.D.   On: 07/14/2020 00:03   CT Head Wo Contrast  Result Date: 07/13/2020 CLINICAL DATA:  Recent fall with headaches and neck pain, initial encounter EXAM: CT HEAD WITHOUT CONTRAST CT CERVICAL SPINE WITHOUT CONTRAST TECHNIQUE: Multidetector CT imaging of the head and cervical spine was performed following the standard protocol without intravenous contrast. Multiplanar CT image reconstructions of the cervical spine were also generated. COMPARISON:  None. FINDINGS: CT HEAD FINDINGS Brain: No evidence of acute infarction, hemorrhage, hydrocephalus, extra-axial collection or mass lesion/mass effect. Mild atrophic changes are noted. Vascular: No hyperdense vessel or unexpected calcification. Skull: Normal. Negative for fracture or focal lesion. Sinuses/Orbits: No acute finding. Other: None. CT CERVICAL SPINE FINDINGS Alignment: Within normal limits with the exception of the odontoid Skull base and vertebrae: There is a fracture at the base of the odontoid with the  proximally 1.5 mm of posterior displacement of the odontoid with respect to the body of C2. No significant central canal stenosis is noted. Multilevel facet hypertrophic changes are noted. Disc space narrowing is noted from C3 to C6 with associated osteophytic changes. No other fractures are seen. Soft tissues and spinal canal: Surrounding soft tissue structures are within normal limits. Upper chest: Visualized lung apices are within normal limits. Other: None IMPRESSION: CT of the head: No acute intracranial abnormality noted. CT of the cervical spine: Fracture through the base of the odontoid with only minimal posterior displacement with respect to the body of C2. No other focal fracture is seen. Multilevel degenerative change. Critical Value/emergent results were called by telephone at the time of interpretation on 07/13/2020 at 4:50 pm to Dr. Tilden FossaELIZABETH REES , who verbally acknowledged these results. Electronically Signed   By: Alcide CleverMark  Lukens  M.D.   On: 07/13/2020 16:51   CT Cervical Spine Wo Contrast  Result Date: 07/13/2020 CLINICAL DATA:  Recent fall with headaches and neck pain, initial encounter EXAM: CT HEAD WITHOUT CONTRAST CT CERVICAL SPINE WITHOUT CONTRAST TECHNIQUE: Multidetector CT imaging of the head and cervical spine was performed following the standard protocol without intravenous contrast. Multiplanar CT image reconstructions of the cervical spine were also generated. COMPARISON:  None. FINDINGS: CT HEAD FINDINGS Brain: No evidence of acute infarction, hemorrhage, hydrocephalus, extra-axial collection or mass lesion/mass effect. Mild atrophic changes are noted. Vascular: No hyperdense vessel or unexpected calcification. Skull: Normal. Negative for fracture or focal lesion. Sinuses/Orbits: No acute finding. Other: None. CT CERVICAL SPINE FINDINGS Alignment: Within normal limits with the exception of the odontoid Skull base and vertebrae: There is a fracture at the base of the odontoid with the  proximally 1.5 mm of posterior displacement of the odontoid with respect to the body of C2. No significant central canal stenosis is noted. Multilevel facet hypertrophic changes are noted. Disc space narrowing is noted from C3 to C6 with associated osteophytic changes. No other fractures are seen. Soft tissues and spinal canal: Surrounding soft tissue structures are within normal limits. Upper chest: Visualized lung apices are within normal limits. Other: None IMPRESSION: CT of the head: No acute intracranial abnormality noted. CT of the cervical spine: Fracture through the base of the odontoid with only minimal posterior displacement with respect to the body of C2. No other focal fracture is seen. Multilevel degenerative change. Critical Value/emergent results were called by telephone at the time of interpretation on 07/13/2020 at 4:50 pm to Dr. Tilden Fossa , who verbally acknowledged these results. Electronically Signed   By: Alcide Clever M.D.   On: 07/13/2020 16:51        Scheduled Meds:  LORazepam  1 mg Oral QHS   metoprolol succinate  25 mg Oral Daily   simvastatin  20 mg Oral Daily   traMADol  50 mg Oral BID   Continuous Infusions:  lactated ringers 75 mL/hr at 07/15/20 0018     LOS: 2 days    Marcellus Scott, MD, [place], [facility]. [facility]  To contact the attending provider between 7A-7P or the covering provider during after hours 7P-7A, please log into the web site [email or web address] and access using universal [place] password for that web site. If you do not have the password, please call the hospital operator.

## 2020-07-15 NOTE — TOC Initial Note (Signed)
Transition of Care [facility]) - Initial/Assessment Note    Patient Details  Name: Diane Nolan MRN: [id number] Date of Birth: 1928-12-14  Transition of Care [facility]) CM/SW Contact:    Kermit Balo, RN Phone Number: 07/15/2020, 12:58 PM  Clinical Narrative:                 Patient was living at home independently. Son had [facility] involved and had made some needed safety improvements to the patients home recently.  Pt is currently confused and CM reached out to her son about rehab post hospitalization. Son in agreement. He states he works and there is no other family that can provide the support she will need after discharge.  Son also considering LT placement after rehab. Son in agreement with having [facility] reach out to him to assist in this process. Referral sent to [facility] with [facility]. Pt faxed out in the [facility] area per son choice for SNF rehab. Son asked to speak to Neurosurgery and MD--both updated.  TOC following.    Expected Discharge Plan: Skilled Nursing Facility Barriers to Discharge: Continued Medical Work up   Patient Goals and CMS Choice   CMS Medicare.gov Compare Post Acute Care list provided to:: Patient Represenative (must comment) Choice offered to / list presented to : Adult Children  Expected Discharge Plan and Services Expected Discharge Plan: Skilled Nursing Facility In-house Referral: Clinical Social Work Discharge Planning Services: CM Consult Post Acute Care Choice: Skilled Nursing Facility Living arrangements for the past 2 months: Single Family Home                                      Prior Living Arrangements/Services Living arrangements for the past 2 months: Single Family Home Lives with:: Self Patient language and need for interpreter reviewed:: Yes Do you feel safe going back to the place where you live?: Yes      Need for Family Participation in Patient Care: Yes (Comment) Care giver support system  in place?: No (comment)   Criminal Activity/Legal Involvement Pertinent to Current Situation/Hospitalization: No - Comment as needed  Activities of Daily Living      Permission Sought/Granted                  Emotional Assessment Appearance:: Appears stated age     Orientation: : Oriented to Self, Oriented to  Time   Psych Involvement: No (comment)  Admission diagnosis:  Fall [W19.XXXA] Leg weakness, bilateral [R29.898] Odontoid fracture with type II morphology and posterior displacement (HCC) [S12.111A] Closed displaced fracture of second cervical vertebra, unspecified fracture morphology, initial encounter [facility]) [S12.100A] Patient Active Problem List   Diagnosis Date Noted   Odontoid fracture with type II morphology and posterior displacement (HCC) 07/13/2020   Renal insufficiency 07/13/2020   Acute encephalopathy 07/13/2020   Chest pain, rule out acute myocardial infarction 01/04/2016   HTN (hypertension) 01/04/2016   DM2 (diabetes mellitus, type 2) (HCC) 01/04/2016   PCP:  Burton Apley, MD Pharmacy:   [id number] [place], [place] - [address] AT [facility] OF [facility] & [address] [place] [address] Phone: [phone] Fax: [phone]     Social Determinants of Health (SDOH) Interventions    Readmission Risk Interventions No flowsheet data found.

## 2020-07-15 NOTE — [place] FL2 (Signed)
[place] MEDICAID FL2 LEVEL OF CARE SCREENING TOOL     IDENTIFICATION  Patient Name: Diane Nolan Birthdate: 07/11/1928 Sex: female Admission Date (Current Location): 07/13/2020  [facility] and [place] Number:  Producer, television/film/video and Address:  [facility] [place], [address], [place], [place] [address]      Provider Number: [id number]  Attending Physician Name and Address:  Elease Etienne, MD  Relative Name and Phone Number:       Current Level of Care: Hospital Recommended Level of Care: Skilled Nursing Facility Prior Approval Number:    Date Approved/Denied:   PASRR Number: [id number]  Discharge Plan: SNF    Current Diagnoses: Patient Active Problem List   Diagnosis Date Noted   Odontoid fracture with type II morphology and posterior displacement (HCC) 07/13/2020   Renal insufficiency 07/13/2020   Acute encephalopathy 07/13/2020   Chest pain, rule out acute myocardial infarction 01/04/2016   HTN (hypertension) 01/04/2016   DM2 (diabetes mellitus, type 2) (HCC) 01/04/2016    Orientation RESPIRATION BLADDER Height & Weight     Self, Time  Normal Incontinent Weight: 49.9 kg Height:  5' 0.5" (153.7 cm)  BEHAVIORAL SYMPTOMS/MOOD NEUROLOGICAL BOWEL NUTRITION STATUS      Continent Diet (Regular with thin liquids)  AMBULATORY STATUS COMMUNICATION OF NEEDS Skin   Extensive Assist Verbally Other (Comment) (laceration to head with staples)                       Personal Care Assistance Level of Assistance  Bathing, Feeding, Dressing Bathing Assistance: Limited assistance Feeding assistance: Limited assistance Dressing Assistance: Maximum assistance     Functional Limitations Info  Sight, Hearing, Speech Sight Info: Adequate Hearing Info: Adequate Speech Info: Adequate    SPECIAL CARE FACTORS FREQUENCY  PT (By licensed PT), OT (By licensed OT)     PT Frequency: 5x/wk OT Frequency: 5x/wk            Contractures Contractures  Info: Not present    Additional Factors Info  Code Status, Allergies, Psychotropic Code Status Info: [place] Allergies Info: NKA Psychotropic Info: Ativan 1 mg at bedtime/ Ultram 50 mg BiD         Current Medications (07/15/2020):  This is the current hospital active medication list Current Facility-Administered Medications  Medication Dose Route Frequency Provider Last Rate Last Admin   acetaminophen (TYLENOL) tablet 650 mg  650 mg Oral Q6H PRN Hillary Bow, DO   650 mg at 07/14/20 1711   Or   acetaminophen (TYLENOL) suppository 650 mg  650 mg Rectal Q6H PRN Hillary Bow, DO       HYDROcodone-acetaminophen (NORCO/VICODIN) 5-325 MG per tablet 1 tablet  1 tablet Oral Q6H PRN Hillary Bow, DO   1 tablet at 07/14/20 1400   lactated ringers infusion   Intravenous Continuous Hillary Bow, DO 75 mL/hr at 07/15/20 0018 New Bag at 07/15/20 0018   LORazepam (ATIVAN) tablet 1 mg  1 mg Oral QHS Lyda Perone M, DO   1 mg at 07/14/20 2227   metoprolol succinate (TOPROL-XL) 24 hr tablet 25 mg  25 mg Oral Daily Lyda Perone M, DO   25 mg at 07/15/20 1127   ondansetron (ZOFRAN) tablet 4 mg  4 mg Oral Q6H PRN Hillary Bow, DO       Or   ondansetron [facility]) injection 4 mg  4 mg Intravenous Q6H PRN Hillary Bow, DO  simvastatin (ZOCOR) tablet 20 mg  20 mg Oral Daily Lyda Perone M, DO   20 mg at 07/15/20 1127   traMADol (ULTRAM) tablet 50 mg  50 mg Oral BID Hillary Bow, DO   50 mg at 07/15/20 1127     Discharge Medications: Please see discharge summary for a list of discharge medications.  Relevant Imaging Results:  Relevant Lab Results:   Additional Information SS#: [id number]--ML in an [address]  Kermit Balo, [place]

## 2020-07-15 NOTE — Evaluation (Signed)
Occupational Therapy Evaluation Patient Details Name: Diane Nolan MRN: [id number] DOB: Jan 26, 1928 Today's Date: 07/15/2020    History of Present Illness Diane Nolan is a 87 year old female with past medical history significant for essential hypertension, type 2 diabetes mellitus, Bil knee OA who presented to [facility] via EMS after being found down at home following an unwitnessed fall.  Patient was found by her son who was coming over to pick her up for a doctor's appointment; for apparently recurrent falls.  CT C-spine with fracture through the base of the odontoid with minimal posterior displacement with respect to the body of C2.  Per NS eval non operative, wear c-collar AAT.   Clinical Impression   Patient admitted for the diagnosis above.  PTA it sounds as if she lived at home alone, but had support from her son and other family members on a PRN basis.  She no longer drives, and has been having increasing falls at home.  Barriers are listed below.  Currently she is needing up to Mod A for basic mobility and ADL completion at a seated level.  OT will follow in the acute setting to maximize her functional status, but SNF is recommended for post acute rehab.  The patient currently needs 24 hour moderate assist, but it appears, she only has PRN assist at home.  She would not be able to care for herself currently.      Follow Up Recommendations  SNF    Equipment Recommendations  3 in 1 bedside commode;Tub/shower seat    Recommendations for Other Services       Precautions / Restrictions Precautions Precautions: Fall Required Braces or Orthoses: Cervical Brace Cervical Brace: Hard collar;At all times Restrictions Weight Bearing Restrictions: No      Mobility Bed Mobility Overal bed mobility: Needs Assistance Bed Mobility: Rolling;Sidelying to Sit;Sit to Sidelying Rolling: Supervision   Supine to sit: Supervision;HOB elevated   Sit to sidelying: Min assist;HOB elevated General bed  mobility comments: cueing for log rolling Patient Response: Cooperative  Transfers Overall transfer level: Needs assistance   Transfers: Sit to/from [employer] Transfers Sit to Stand: Min assist Stand pivot transfers: Mod assist       General transfer comment: HHA of one, and reaching for hand rail to side step to [facility].    Balance Overall balance assessment: Needs assistance Sitting-balance support: Feet supported Sitting balance-Leahy Scale: Fair     Standing balance support: Bilateral upper extremity supported Standing balance-Leahy Scale: Poor Standing balance comment: requires external support                           ADL either performed or assessed with clinical judgement   ADL Overall ADL's : Needs assistance/impaired Eating/Feeding: Supervision/ safety Eating/Feeding Details (indicate cue type and reason): with c-collar in place. Grooming: Wash/dry hands;Wash/dry face;Supervision/safety;Sitting           Upper Body Dressing : Moderate assistance;Sitting   Lower Body Dressing: Moderate assistance;Sit to/from stand               Functional mobility during ADLs: Moderate assistance General ADL Comments: HHA     Vision Patient Visual Report: No change from baseline       Perception     Praxis      Pertinent Vitals/Pain Pain Assessment: No/denies pain Pain Intervention(s): Monitored during session     Hand Dominance Right   Extremity/Trunk Assessment Upper Extremity Assessment Upper Extremity Assessment: Overall WFL for tasks  assessed   Lower Extremity Assessment Lower Extremity Assessment: Defer to PT evaluation   Cervical / Trunk Assessment Cervical / Trunk Assessment: Kyphotic   Communication Communication Communication: No difficulties   Cognition Arousal/Alertness: Awake/alert Behavior During Therapy: Restless Overall Cognitive Status: No family/caregiver present to determine baseline cognitive functioning                    Orientation Level: Situation;Time     Following Commands: Follows one step commands consistently;Follows multi-step commands inconsistently Safety/Judgement: Decreased awareness of deficits;Decreased awareness of safety   Problem Solving: Requires verbal cues;Requires tactile cues     General Comments  applied c-collar and ill-fitting despite my best efforts, noted errythema at base of brace on her neck, RN aware.    Exercises     Shoulder Instructions      Home Living Family/patient expects to be discharged to:: Private residence Living Arrangements: Alone Available Help at Discharge: Family;Available PRN/intermittently Type of Home: House Home Access: Stairs to enter           [employer] Shower/Tub: Producer, television/film/video: Standard Bathroom Accessibility: Yes How Accessible: Accessible via walker Home Equipment: Walker - 2 wheels          Prior Functioning/Environment Level of Independence: Independent with assistive device(s)        Comments: patient states she continued to cook, could bathe and dress herself, no longer drive, and had help from son and other family members.  Stated she was supposed to walk with her walker.        OT Problem List: Decreased activity tolerance;Impaired balance (sitting and/or standing);Decreased knowledge of use of DME or AE;Decreased safety awareness      OT Treatment/Interventions: Self-care/ADL training;DME and/or AE instruction;Balance training;Patient/family education;Therapeutic activities    OT Goals(Current goals can be found in the care plan section) Acute Rehab OT Goals Patient Stated Goal: I'm going home OT Goal Formulation: With patient Time For Goal Achievement: 07/29/20 Potential to Achieve Goals: Fair ADL Goals Pt Will Perform Grooming: with set-up;sitting;standing Pt Will Perform Lower Body Bathing: with min assist;sit to/from stand Pt Will Perform Lower Body Dressing: with  min assist;sit to/from stand Pt Will Transfer to Toilet: with supervision;ambulating;regular height toilet Pt Will Perform Toileting - Clothing Manipulation and hygiene: with supervision;sit to/from stand  OT Frequency: Min 2X/week   Barriers to D/C: Decreased caregiver support          Co-evaluation              AM-PAC OT "6 Clicks" Daily Activity     Outcome Measure Help from another person eating meals?: None Help from another person taking care of personal grooming?: None Help from another person toileting, which includes using toliet, bedpan, or urinal?: A Little Help from another person bathing (including washing, rinsing, drying)?: A Lot Help from another person to put on and taking off regular upper body clothing?: A Lot Help from another person to put on and taking off regular lower body clothing?: A Lot 6 Click Score: 17   End of Session Equipment Utilized During Treatment: Gait belt  Activity Tolerance: Patient tolerated treatment well Patient left: in chair;with call bell/phone within reach;with bed alarm set;with restraints reapplied  OT Visit Diagnosis: Unsteadiness on feet (R26.81);Repeated falls (R29.6)                Time: 1435-1500 OT Time Calculation (min): 25 min Charges:  OT General Charges $OT Visit: 1 Visit OT Evaluation $OT  Eval Moderate Complexity: 1 Mod OT Treatments $Self Care/Home Management : 8-22 mins  07/15/2020  Rich, OTR/L  Acute Rehabilitation Services  Office:  [phone]   Suzanna Obey 07/15/2020, 3:12 PM

## 2020-07-16 ENCOUNTER — Inpatient Hospital Stay (HOSPITAL_COMMUNITY): Payer: Medicare Other

## 2020-07-16 DIAGNOSIS — N390 Urinary tract infection, site not specified: Secondary | ICD-10-CM

## 2020-07-16 DIAGNOSIS — G9340 Encephalopathy, unspecified: Secondary | ICD-10-CM

## 2020-07-16 LAB — BASIC METABOLIC PANEL
Anion gap: 8 (ref 5–15)
BUN: 21 mg/dL (ref 8–23)
CO2: 25 mmol/L (ref 22–32)
Calcium: 8.5 mg/dL — ABNORMAL LOW (ref 8.9–10.3)
Chloride: 96 mmol/L — ABNORMAL LOW (ref 98–111)
Creatinine, Ser: 1.45 mg/dL — ABNORMAL HIGH (ref 0.44–1.00)
GFR, Estimated: 34 mL/min — ABNORMAL LOW (ref >60)
Glucose, Bld: 92 mg/dL (ref 70–99)
Potassium: 3.9 mmol/L (ref 3.5–5.1)
Sodium: 129 mmol/L — ABNORMAL LOW (ref 135–145)

## 2020-07-16 LAB — URINALYSIS, ROUTINE W REFLEX MICROSCOPIC
Bilirubin Urine: NEGATIVE
Glucose, UA: NEGATIVE mg/dL
Ketones, ur: 5 mg/dL — AB
Nitrite: POSITIVE — AB
Protein, ur: NEGATIVE mg/dL
Specific Gravity, Urine: 1.014 (ref 1.005–1.030)
pH: 6 (ref 5.0–8.0)

## 2020-07-16 MED ORDER — SODIUM CHLORIDE 0.9 % IV SOLN
1.0000 g | INTRAVENOUS | Status: DC
  Administered 2020-07-16 – 2020-07-18 (×3): 1 g via INTRAVENOUS
  Filled 2020-07-16 (×3): qty 10

## 2020-07-16 MED ORDER — SODIUM CHLORIDE 0.9 % IV SOLN: INTRAVENOUS | Status: DC

## 2020-07-16 MED ORDER — FLUCONAZOLE 150 MG PO TABS
150.0000 mg | ORAL_TABLET | Freq: Once | ORAL | Status: AC
  Administered 2020-07-16: 150 mg via ORAL
  Filled 2020-07-16 (×2): qty 1

## 2020-07-16 MED ORDER — CYANOCOBALAMIN 1000 MCG/ML IJ SOLN
1000.0000 ug | Freq: Every day | INTRAMUSCULAR | Status: AC
  Administered 2020-07-16 – 2020-07-20 (×5): 1000 ug via INTRAMUSCULAR
  Filled 2020-07-16 (×6): qty 1

## 2020-07-16 MED ORDER — LIP MEDEX EX OINT
TOPICAL_OINTMENT | CUTANEOUS | Status: DC | PRN
  Filled 2020-07-16: qty 7

## 2020-07-16 NOTE — Progress Notes (Signed)
PROGRESS NOTE    Diane Nolan  UXN:[id number] DOB: Oct 08, 1928 DOA: 07/13/2020 PCP: Burton Apley, MD    Brief Narrative:  Diane Nolan is a 87 year old female with past medical history significant for essential hypertension, type 2 diabetes mellitus who presented to [facility] via EMS after being found down at home following an unwitnessed fall.  Patient was found by her son who was coming over to pick her up for a doctor's appointment; for apparently recurrent falls.  In the ED, her 98.4 F, HR 84, RR 16, BP 118/64, SPO2 100% on room air.  WBC 9.9, hemoglobin 12.2, platelets 217.  Sodium 136, potassium 3.8, chloride 105, CO2 23, glucose 126, BUN 30, creatinine 1.69.  AST 19, ALT 8, total bilirubin 1.7.  COVID-19 PCR negative.  Influenza A/B PCR negative.  CT head with no acute intercranial abnormality.  CT C-spine with fracture through the base of the odontoid with minimal posterior displacement with respect to the body of C2, no other focal fracture noted.  Chest x-ray with no acute cardiopulmonary disease process, noted aortic atherosclerosis.  Left knee x-ray with severe tricompartmental degenerative arthritis, no acute fracture/dislocation.  Right knee x-ray with advanced degenerative changes, no acute bony abnormality.  Pelvis x-ray, no acute bony abnormality.  Neurosurgery consulted.  TRH consulted for further evaluation and management of acute C2 fracture with associated recurrent falls and acute renal failure.  Patient was transferred to [facility] for [facility] evaluation.  Neurosurgery recommends no surgical intervention for type II odontoid fracture, should wear hard collar at all times, outpatient follow-up with Dr. Lovell Sheehan in 4 weeks.  At family's request, Neurology consulted on 6/29 for evaluation of confusion.   Assessment & Plan:   Principal Problem:   Odontoid fracture with type II morphology and posterior displacement (HCC) Active Problems:   Renal insufficiency   Acute  encephalopathy   Odontoid fracture with type II morphology and posterior displacement Presented from home after being found down for unknown time by son. Patient with history of recurrent falls.  CT head unrevealing and CT C-spine with odontoid fracture with minimal posterior displacement. --Neurosurgery evaluation/recommendations: Seen on 6/28.  Indicates that she has a type II odontoid fracture following a fall for which no surgical intervention is recommended, should wear hard cervical collar at all times and goal is for the fracture to heal with time, outpatient follow-up with Dr. Lovell Sheehan in 4 weeks at which time a lateral x-ray of the C-spine will be performed. -Therapies recommended SNF, TOC on board, SNF search ongoing. -Pain appears to be controlled on twice daily tramadol and has not required much of as needed meds.  Tramadol is her home medication and less likely to be the cause of her mental status changes.  Acute renal failure complicating underlying possible stage IIIb CKD. - Creatinine 1.69 at time of admission.  Baseline 0.89 from 01/04/2016 and no recent lab work since, even in [employer].  Etiology likely secondary to prerenal azotemia from dehydration after being found down.  CK1 92. -After IV fluids, creatinine improved from 1.69 but has plateaued in the 1.4 range for the last 2 days which may be her baseline.  Clinically still appears slightly dry, possibly from poor oral intake, will continue gentle IV fluids for additional 24 hours and follow BMP.  Although low yield, will get a renal ultrasound to rule out hydronephrosis.  Dehydration with hyponatremia: Continue IV saline hydration.  Follow BMP.  Encourage oral intake  Acute metabolic encephalopathy Likely due to  acute illness, hospitalization, pain, pain meds, AKI, UTI which could be contributing to undiagnosed age-related cognitive impairment/dementia and B12 deficiency.  Delirium cautions.  Minimize sedatives and  opioids.  Tele sitter ordered for safety.  TSH normal.  B12 low, started parenteral B12 shots x5 days.  UA was not sent yesterday, sent catheterized sample 6/30-indicative of UTI, added urine culture to previous sample and started ceftriaxone 1 g daily.  Having some visual hallucinations today.  Treat the treatable as of above and monitor closely.  EEG normal.  B12 deficiency: Follow MMA results which may not be available for several days.  Started vitamin B12 IM daily x5 days  Vaginal candidiasis Fluconazole 150 mg x 1 on 6/30.  Essential hypertension Controlled on Toprol-XL 25 Mg daily.  HLD:  Simvastatin 20 mg p.o. daily  Anxiety:  Patient chronically on lorazepam 2 mg at bedtime, here on 1 mg at bedtime.  Continue same and defer to outpatient follow-up regarding gradual taper to wean off.  Advanced degenerative joint disease Severe tricompartmental disease noted on left knee with advanced degenerative disease noted on right.  On tramadol 50 mg p.o. twice daily at home. --Continue Tylenol, home tramadol, as needed Norco  Acute urinary retention: S/p in and out catheterization x2 on 6/28.  Per RN report, since then has been voiding without difficulty.   DVT prophylaxis: SCDs Start: 07/13/20 2003   Code Status: Full Code Family Communication: Discussed in detail with patient's son on 6/30 who is a Public librarian and [place], [place].  Updated care and answered all questions.  Remains inpatient appropriate because:Unsafe d/c plan, IV treatments appropriate due to intensity of illness or inability to take PO, and Inpatient level of care appropriate due to severity of illness  Dispo: The patient is from: Home              Anticipated d/c is to: SNF              Patient currently is not medically stable to d/c.   Difficult to place patient No  Consultants:  Neurosurgery Neurology  Procedures:  None  Antimicrobials:  None   Subjective: Per RN, vaginal discharge plus groin rash.   Also more confused, delirious and intermittently seeing snakes on the wall.  Patient is alert, oriented to self and partly to place only.  Follows simple instructions.  Objective: Vitals:   07/15/20 2352 07/16/20 0402 07/16/20 0725 07/16/20 1146  BP: (!) 147/63 127/66 (!) 142/68 137/69  Pulse: 77 80 79 72  Resp: 19  15 16   Temp: 98.3 F (36.8 C) 97.8 F (36.6 C) 98 F (36.7 C) 97.9 F (36.6 C)  TempSrc: Oral Oral Oral Oral  SpO2:  97%  100%  Weight:      Height:        Intake/Output Summary (Last 24 hours) at 07/16/2020 1410 Last data filed at 07/16/2020 1100 Gross per 24 hour  Intake --  Output 800 ml  Net -800 ml   Filed Weights   07/13/20 1554  Weight: 49.9 kg    Examination:  General exam: Pleasant elderly female, moderately built and frail, lying comfortably propped up in bed without distress.  Lips/oral mucosa dry. Neck: Has hard cervical collar in place. Respiratory system: Clear to auscultation.  No increased work of breathing. Cardiovascular system: S1 and S2 heard, RRR.  No JVD, murmurs or pedal edema.  Short systolic murmur 2/6 best heard at apex. Gastrointestinal system: Abdomen is nondistended, soft and nontender. No organomegaly  or masses felt. Normal bowel sounds heard. Central nervous system: Alert and oriented to self and partly to place.  No focal deficits. Extremities: Symmetric 5 x 5 power. Skin: No rashes, lesions or ulcers Psychiatry: Judgement and insight impaired.  Mood & affect pleasantly confused.     Data Reviewed: I have personally reviewed following labs and imaging studies  CBC: Recent Labs  Lab 07/13/20 1607  WBC 9.9  NEUTROABS 8.4*  HGB 12.2  HCT 37.8  MCV 96.2  PLT 217   Basic Metabolic Panel: Recent Labs  Lab 07/13/20 1636 07/15/20 0250 07/16/20 0432  NA 136 133* 129*  K 3.8 3.7 3.9  CL 105 102 96*  CO2 23 24 25   GLUCOSE 126* 125* 92  BUN 30* 25* 21  CREATININE 1.69* 1.46* 1.45*  CALCIUM 8.6* 8.6* 8.5*    GFR: Estimated Creatinine Clearance: 19 mL/min (A) (by C-G formula based on SCr of 1.45 mg/dL (H)). Liver Function Tests: Recent Labs  Lab 07/13/20 1636  AST 19  ALT 8  ALKPHOS 110  BILITOT 1.7*  PROT 6.6  ALBUMIN 3.4*    Cardiac Enzymes: Recent Labs  Lab 07/13/20 1636  CKTOTAL 192    CBG: Recent Labs  Lab 07/13/20 2231  GLUCAP 112*    Recent Results (from the past 240 hour(s))  Resp Panel by RT-PCR (Flu A&B, Covid) Nasopharyngeal Swab     Status: None   Collection Time: 07/13/20  5:46 PM   Specimen: Nasopharyngeal Swab; Nasopharyngeal(NP) swabs in vial transport medium  Result Value Ref Range Status   SARS Coronavirus 2 by RT PCR NEGATIVE NEGATIVE Final    Comment: (NOTE) SARS-CoV-2 target nucleic acids are NOT DETECTED.  The SARS-CoV-2 RNA is generally detectable in upper respiratory specimens during the acute phase of infection. The lowest concentration of SARS-CoV-2 viral copies this assay can detect is 138 copies/mL. A negative result does not preclude SARS-Cov-2 infection and should not be used as the sole basis for treatment or other patient management decisions. A negative result may occur with  improper specimen collection/handling, submission of specimen other than nasopharyngeal swab, presence of viral mutation(s) within the areas targeted by this assay, and inadequate number of viral copies(<138 copies/mL). A negative result must be combined with clinical observations, patient history, and epidemiological information. The expected result is Negative.  Fact Sheet for Patients:  07/15/20  Fact Sheet for Healthcare Providers:  BloggerCourse.com  This test is no t yet approved or cleared by the [employer]ious[employer].[place] FDA and  has been authorized for detection and/or diagnosis of SARS-CoV-2 by FDA under an Emergency Use Authorization (EUA). This EUA will remain  in effect (meaning this test can be  used) for the duration of the COVID-19 declaration under Section 564(b)(1) of the Act, 21 U.S.C.section 360bbb-3(b)(1), unless the authorization is terminated  or revoked sooner.       Influenza A by PCR NEGATIVE NEGATIVE Final   Influenza B by PCR NEGATIVE NEGATIVE Final    Comment: (NOTE) The Xpert Xpress SARS-CoV-2/FLU/RSV plus assay is intended as an aid in the diagnosis of influenza from Nasopharyngeal swab specimens and should not be used as a sole basis for treatment. Nasal washings and aspirates are unacceptable for Xpert Xpress SARS-CoV-2/FLU/RSV testing.  Fact Sheet for Patients: [place]  Fact Sheet for Healthcare Providers: BloggerCourse.com  This test is not yet approved or cleared by the SeriousBroker.it FDA and has been authorized for detection and/or diagnosis of SARS-CoV-2 by FDA under an Emergency Use Authorization (  EUA). This EUA will remain in effect (meaning this test can be used) for the duration of the COVID-19 declaration under Section 564(b)(1) of the Act, 21 U.S.C. section 360bbb-3(b)(1), unless the authorization is terminated or revoked.  Performed at [facility], [address] [place], [place] [address]          Radiology Studies: MR BRAIN WO CONTRAST  Result Date: 07/15/2020 CLINICAL DATA:  Found down.  Confusion. EXAM: MRI HEAD WITHOUT CONTRAST TECHNIQUE: Multiplanar, multiecho pulse sequences of the brain and surrounding structures were obtained without intravenous contrast. COMPARISON:  None. FINDINGS: Brain: No acute infarct, mass effect or extra-axial collection. No acute or chronic hemorrhage. Normal white matter signal, parenchymal volume and CSF spaces. The midline structures are normal. Vascular: Major flow voids are preserved. Skull and upper cervical spine: Normal calvarium and skull base. Visualized upper cervical spine and soft tissues are normal.  Sinuses/Orbits:No paranasal sinus fluid levels or advanced mucosal thickening. No mastoid or middle ear effusion. Normal orbits. IMPRESSION: Normal aging brain. Electronically Signed   By: Deatra RobinsonKevin  Herman M.D.   On: 07/15/2020 23:32   EEG adult  Result Date: 07/16/2020 Diane QuestYadav, Priyanka O, MD     07/16/2020 12:04 PM Patient Name: Diane Nolan MRN: [id number] Epilepsy Attending: Charlsie QuestPriyanka O Yadav Referring Physician/Provider: Jimmye NormanKaren Kirby-Graham, NP Date: 07/16/2020 Duration: 23.30 mins Patient history: 87 year old female with unwitnessed fall and confusion.  EEG to evaluate for seizures. Level of alertness: Awake AEDs during EEG study: Ativan Technical aspects: This EEG study was done with scalp electrodes positioned according to the 10-20 International system of electrode placement. Electrical activity was acquired at a sampling rate of 500Hz  and reviewed with a high frequency filter of 70Hz  and a low frequency filter of 1Hz . EEG data were recorded continuously and digitally stored. Description: The posterior dominant rhythm consists of 8Hz  activity of moderate voltage (25-35 uV) seen predominantly in posterior head regions, symmetric and reactive to eye opening and eye closing. Hyperventilation and photic stimulation were not performed.   IMPRESSION: This study is within normal limits. No seizures or epileptiform discharges were seen throughout the recording. Priyanka Annabelle Harman Yadav        Scheduled Meds:  cyanocobalamin  1,000 mcg Intramuscular Daily   LORazepam  1 mg Oral QHS   metoprolol succinate  25 mg Oral Daily   simvastatin  20 mg Oral Daily   traMADol  50 mg Oral BID   Continuous Infusions:  cefTRIAXone (ROCEPHIN)  IV     lactated ringers 75 mL/hr at 07/15/20 0018     LOS: 3 days    Marcellus ScottAnand Shira Bobst, MD, ShannonFACP, [facility] HospitalFHM. [facility]  To contact the attending provider between 7A-7P or the covering provider during after hours 7P-7A, please log into the web site [email or web address] and access using  universal [place] password for that web site. If you do not have the password, please call the hospital operator.

## 2020-07-16 NOTE — Progress Notes (Addendum)
Neurology Progress Note  S: Patient more confused overnight with visual hallucinations of snakes and wood on wall. She feels fine. No neck pain. No n/v, HA, dizziness.   O: Current vital signs: BP (!) 142/68 (BP Location: Right Arm)   Pulse 79   Temp 98 F (36.7 C) (Oral)   Resp 15   Ht 5' 0.5" (1.537 m)   Wt 49.9 kg   SpO2 97%   BMI 21.13 kg/m  Vital signs in last 24 hours: Temp:  [97.6 F (36.4 C)-98.3 F (36.8 C)] 98 F (36.7 C) (06/30 0725) Pulse Rate:  [77-88] 79 (06/30 0725) Resp:  [15-19] 15 (06/30 0725) BP: (127-148)/(62-74) 142/68 (06/30 0725) SpO2:  [97 %-100 %] 97 % (06/30 0402)  GENERAL: Awake, alert in NAD HEENT: Normocephalic and atraumatic. Hard Cspine collar in place.  LUNGS: Normal respiratory effort.  CV: RRR on tele.  ABDOMEN: Soft, nontender. Ext: warm  NEURO:  Mental Status: Alert and oriented to name, age, month, year. Disoriented to place (in bedroom), day, date, or president. Unable to tell NP how many quarters are in $2.75. Unable to spell WORLD backwards.  Follow simple commands. Tells NP there is a snake and pieces or wood on the wall in her room.  Speech/Language: speech is tangential.   Cranial Nerves:  II: PERRL.  III, IV, VI: EOMI. Eyelids elevate symmetrically.  V: Sensation is intact to light touch and symmetrical to face.  VII: Smile is grossly symmetrical, but full smile is slightly inhibited due to collar.  VIII: hearing intact to voice. IX, X: Palate elevates symmetrically. Phonation is normal.  FK:CLEXNTZG shrug 5/5. XII: tongue is midline without fasciculations. Motor: 5/5 strength to grips, does not participate in biceps and triceps. Is able to lift LEs off bed today with drift. LEs thighs 4-/5, knees 4-/5, dorsiflexion/plantar flexion 4+/5.  Sensation- Intact to light touch bilaterally.  Coordination: FTN intact bilaterally, HKS-unable to perform. No drift UEs.   DTRs: 1+ brachioradialis. 0 Patella.  Gait-  deferred.  Medications  Current Facility-Administered Medications:    acetaminophen (TYLENOL) tablet 650 mg, 650 mg, Oral, Q6H PRN **OR** acetaminophen (TYLENOL) suppository 650 mg, 650 mg, Rectal, Q6H PRN, Hongalgi, Maximino Greenland, MD   lactated ringers infusion, , Intravenous, Continuous, Hillary Bow, DO, Last Rate: 75 mL/hr at 07/15/20 0018, New Bag at 07/15/20 0018   lip balm (CARMEX) ointment, , Topical, PRN, Hongalgi, Maximino Greenland, MD   LORazepam (ATIVAN) tablet 1 mg, 1 mg, Oral, QHS, Julian Reil, Jared M, DO, 1 mg at 07/15/20 2116   metoprolol succinate (TOPROL-XL) 24 hr tablet 25 mg, 25 mg, Oral, Daily, Julian Reil, Jared M, DO, 25 mg at 07/15/20 1127   ondansetron (ZOFRAN) tablet 4 mg, 4 mg, Oral, Q6H PRN **OR** ondansetron (ZOFRAN) injection 4 mg, 4 mg, Intravenous, Q6H PRN, Julian Reil, Jared M, DO   simvastatin (ZOCOR) tablet 20 mg, 20 mg, Oral, Daily, Julian Reil, Jared M, DO, 20 mg at 07/15/20 1127   traMADol (ULTRAM) tablet 50 mg, 50 mg, Oral, BID, Lyda Perone M, DO, 50 mg at 07/15/20 2116  Pertinent Labs Vitamin B12 158. TSH 3.220. UA to be collected.   Imaging: MRI brain "normal aging brain".   Assessment: 87 yo female admitted 2 days ago for unwitnessed fall and confusion afterwards. MRI brain negative for stroke. EEG is pending. Vitamin B12 level was low and NP ordered supplementation. Patient is likely to be experiencing multifactorial toxic encephalopathy with delirium with questionable underlying cognitive issue.   Recommendatons: -B12 supplementation  ordered.  -Delirium precautions as listed on 07/15/20 note.  -Continue to avoid medications on Beers criteria.  -Would avoid Benzo on discharge if at all possible.  -Follow UA.  -We will f/up EEG.  -Neurology will be available for questions as needed unless EEG is positive.  -Outpatient neuropsychological testing  Pt seen by Jimmye Norman, MSN, APN-BC/Nurse Practitioner/Neuro and later by MD. Note and plan to be edited as needed by  MD.  Pager: [phone]   Attending addendum Patient seen and examined Has waxing and waning mentation I suspect that she has underlying neurocognitive deficits, although not very visible or clear in her normal day-to-day life, but have been unmasked during this acute hospitalization.  That said there are reversible causes such as UTI and B12 deficiency that should be treated prior to performing a formal neuropsychological test to determine what domains does she have deficits on in terms of her cognition. EEG was completed-normal As recommended above by Maren Reamer, NP-B12 supplementation, delirium precautions, avoiding sedating medications and treatment of reversible causes would be the mainstay of her current management. She will need outpatient follow-up with formal neuropsychological testing-[place] [facility]. Plan was discussed with Dr. Waymon Amato over the phone Please call with questions as needed -- Milon Dikes, MD Neurologist [facility] Pager: [phone]

## 2020-07-16 NOTE — Progress Notes (Signed)
EEG Completed; Results Pending  

## 2020-07-16 NOTE — TOC Progression Note (Signed)
Transition of Care [facility]) - Progression Note    Patient Details  Name: Diane Nolan MRN: [id number] Date of Birth: 11/19/1928  Transition of Care [facility]) CM/SW Contact  Kermit Balo, RN Phone Number: 07/16/2020, 1:25 PM  Clinical Narrative:    Bed offers for SNF rehab emailed to patients son per request. TOC following.   Expected Discharge Plan: Skilled Nursing Facility Barriers to Discharge: Continued Medical Work up  Expected Discharge Plan and Services Expected Discharge Plan: Skilled Nursing Facility In-house Referral: Clinical Social Work Discharge Planning Services: CM Consult Post Acute Care Choice: Skilled Nursing Facility Living arrangements for the past 2 months: Single Family Home                                       Social Determinants of Health (SDOH) Interventions    Readmission Risk Interventions No flowsheet data found.

## 2020-07-16 NOTE — Procedures (Signed)
Patient Name: Diane Nolan  MRN: [id number]  Epilepsy Attending: Charlsie Quest  Referring Physician/Provider: Jimmye Norman, NP Date: 07/16/2020 Duration: 23.30 mins  Patient history: 87 year old female with unwitnessed fall and confusion.  EEG to evaluate for seizures.  Level of alertness: Awake  AEDs during EEG study: Ativan  Technical aspects: This EEG study was done with scalp electrodes positioned according to the 10-20 International system of electrode placement. Electrical activity was acquired at a sampling rate of 500Hz  and reviewed with a high frequency filter of 70Hz  and a low frequency filter of 1Hz . EEG data were recorded continuously and digitally stored.   Description: The posterior dominant rhythm consists of 8Hz  activity of moderate voltage (25-35 uV) seen predominantly in posterior head regions, symmetric and reactive to eye opening and eye closing. Hyperventilation and photic stimulation were not performed.     IMPRESSION: This study is within normal limits. No seizures or epileptiform discharges were seen throughout the recording.  Diane Nolan 

## 2020-07-17 LAB — BASIC METABOLIC PANEL
Anion gap: 7 (ref 5–15)
BUN: 21 mg/dL (ref 8–23)
CO2: 24 mmol/L (ref 22–32)
Calcium: 8.1 mg/dL — ABNORMAL LOW (ref 8.9–10.3)
Chloride: 103 mmol/L (ref 98–111)
Creatinine, Ser: 1.36 mg/dL — ABNORMAL HIGH (ref 0.44–1.00)
GFR, Estimated: 37 mL/min — ABNORMAL LOW (ref >60)
Glucose, Bld: 98 mg/dL (ref 70–99)
Potassium: 3.6 mmol/L (ref 3.5–5.1)
Sodium: 134 mmol/L — ABNORMAL LOW (ref 135–145)

## 2020-07-17 LAB — SARS CORONAVIRUS 2 (TAT 6-24 HRS): SARS Coronavirus 2: NEGATIVE

## 2020-07-17 MED ORDER — WHITE PETROLATUM EX OINT
TOPICAL_OINTMENT | CUTANEOUS | Status: AC
  Administered 2020-07-17: 0.2
  Filled 2020-07-17: qty 28.35

## 2020-07-17 NOTE — Progress Notes (Signed)
Physical Therapy Treatment Patient Details Name: Diane Nolan MRN: [id number] DOB: [birth year] Today's Date: 07/17/2020    History of Present Illness Diane Nolan is a 87 year old female with past medical history significant for essential hypertension, type 2 diabetes mellitus, Bil knee OA who presented to [facility] via EMS after being found down at home following an unwitnessed fall.  Patient was found by her son who was coming over to pick her up for a doctor's appointment; for apparently recurrent falls.  CT C-spine with fracture through the base of the odontoid with minimal posterior displacement with respect to the body of C2.  Per NS eval non operative, wear c-collar AAT.    PT Comments    Pt required min assist bed mobility, min assist transfers, and min assist ambulation 25' with RW. Mobility limited by pain and fatigue. Pt in recliner with feet elevated at end of session. Bilat mitts in place.    Follow Up Recommendations  SNF;Supervision/Assistance - 24 hour     Equipment Recommendations  None recommended by PT    Recommendations for Other Services       Precautions / Restrictions Precautions Precautions: Fall Required Braces or Orthoses: Cervical Brace Cervical Brace: Hard collar;At all times Restrictions Weight Bearing Restrictions: No    Mobility  Bed Mobility Overal bed mobility: Needs Assistance Bed Mobility: Supine to Sit     Supine to sit: Min assist;HOB elevated     General bed mobility comments: increased time, cues for sequencing, assist to scoot to EOB    Transfers Overall transfer level: Needs assistance Equipment used: Rolling walker (2 wheeled) Transfers: Sit to/from Stand Sit to Stand: Min assist         General transfer comment: cues for hand placement, assist to power up, increased time  Ambulation/Gait Ambulation/Gait assistance: Min assist Gait Distance (Feet): 25 Feet Assistive device: Rolling walker (2 wheeled) Gait Pattern/deviations:  Step-through pattern;Decreased stride length;Trunk flexed;Antalgic Gait velocity: very slow Gait velocity interpretation: <1.8 ft/sec, indicate of risk for recurrent falls General Gait Details: slow, guarded gait. Pt with c/o R hip pain. Cues for sequencing and proximity to RW.   Stairs             Wheelchair Mobility    Modified Rankin (Stroke Patients Only)       Balance Overall balance assessment: Needs assistance Sitting-balance support: Feet supported;No upper extremity supported Sitting balance-Leahy Scale: Fair     Standing balance support: Bilateral upper extremity supported;During functional activity Standing balance-Leahy Scale: Poor Standing balance comment: requires external support                            Cognition Arousal/Alertness: Awake/alert Behavior During Therapy: WFL for tasks assessed/performed Overall Cognitive Status: No family/caregiver present to determine baseline cognitive functioning Area of Impairment: Orientation;Memory;Following commands;Safety/judgement;Problem solving                 Orientation Level: Disoriented to;Time;Situation   Memory: Decreased short-term memory Following Commands: Follows one step commands consistently;Follows multi-step commands inconsistently Safety/Judgement: Decreased awareness of deficits;Decreased awareness of safety   Problem Solving: Slow processing;Difficulty sequencing;Requires verbal cues General Comments: Episodes of pt speaking Arabic during session but quickly switching back to [place] when asked to repeat herself.      Exercises      General Comments        Pertinent Vitals/Pain Pain Assessment: Faces Faces Pain Scale: Hurts little more Pain Location: neck and shoulders, R hip  Pain Descriptors / Indicators: Grimacing;Sore;Discomfort Pain Intervention(s): Monitored during session;Repositioned;Limited activity within patient's tolerance    Home Living                       Prior Function            PT Goals (current goals can now be found in the care plan section) Acute Rehab PT Goals Patient Stated Goal: not stated Progress towards PT goals: Progressing toward goals    Frequency    Min 2X/week      PT Plan Current plan remains appropriate    Co-evaluation              AM-PAC PT "6 Clicks" Mobility   Outcome Measure  Help needed turning from your back to your side while in a flat bed without using bedrails?: A Little Help needed moving from lying on your back to sitting on the side of a flat bed without using bedrails?: A Lot Help needed moving to and from a bed to a chair (including a wheelchair)?: A Little Help needed standing up from a chair using your arms (e.g., wheelchair or bedside chair)?: A Little Help needed to walk in hospital room?: A Little Help needed climbing 3-5 steps with a railing? : Total 6 Click Score: 15    End of Session Equipment Utilized During Treatment: Cervical collar;Gait belt Activity Tolerance: Patient limited by pain;Patient limited by fatigue Patient left: in chair;with call bell/phone within reach;with chair alarm set Nurse Communication: Mobility status PT Visit Diagnosis: Other abnormalities of gait and mobility (R26.89);Muscle weakness (generalized) (M62.81);History of falling (Z91.81)     Time: 3009-2330 PT Time Calculation (min) (ACUTE ONLY): 24 min  Charges:  $Gait Training: 23-37 mins                     Aida Raider,   Office # [phone] Pager [phone]    Ilda Foil 07/17/2020, 10:41 AM

## 2020-07-17 NOTE — Progress Notes (Signed)
PROGRESS NOTE    Diane Nolan  HOZ:[id number] DOB: 1928/05/13 DOA: 07/13/2020 PCP: Burton Apley, MD    Brief Narrative:  Diane Nolan is a 87 year old female with past medical history significant for essential hypertension, type 2 diabetes mellitus who presented to [facility] via EMS after being found down at home following an unwitnessed fall.  Patient was found by her son who was coming over to pick her up for a doctor's appointment; for apparently recurrent falls.  In the ED, her 98.4 F, HR 84, RR 16, BP 118/64, SPO2 100% on room air.  WBC 9.9, hemoglobin 12.2, platelets 217.  Sodium 136, potassium 3.8, chloride 105, CO2 23, glucose 126, BUN 30, creatinine 1.69.  AST 19, ALT 8, total bilirubin 1.7.  COVID-19 PCR negative.  Influenza A/B PCR negative.  CT head with no acute intercranial abnormality.  CT C-spine with fracture through the base of the odontoid with minimal posterior displacement with respect to the body of C2, no other focal fracture noted.  Chest x-ray with no acute cardiopulmonary disease process, noted aortic atherosclerosis.  Left knee x-ray with severe tricompartmental degenerative arthritis, no acute fracture/dislocation.  Right knee x-ray with advanced degenerative changes, no acute bony abnormality.  Pelvis x-ray, no acute bony abnormality.  Neurosurgery consulted.  TRH consulted for further evaluation and management of acute C2 fracture with associated recurrent falls and acute renal failure.  Patient was transferred to [facility] for neurosurgical evaluation.  Neurosurgery recommends no surgical intervention for type II odontoid fracture, should wear hard collar at all times, outpatient follow-up with Dr. Lovell Sheehan in 4 weeks.  At family's request, Neurology consulted on 6/29 for evaluation of confusion.   Assessment & Plan:   Principal Problem:   Odontoid fracture with type II morphology and posterior displacement (HCC) Active Problems:   Renal insufficiency   Acute  encephalopathy   Odontoid fracture with type II morphology and posterior displacement Presented from home after being found down for unknown time by son. Patient with history of recurrent falls.  CT head unrevealing and CT C-spine with odontoid fracture with minimal posterior displacement. --Neurosurgery evaluation/recommendations: Seen on 6/28.  Indicates that she has a type II odontoid fracture following a fall for which no surgical intervention is recommended, should wear hard cervical collar at all times and goal is for the fracture to heal with time, outpatient follow-up with Dr. Lovell Sheehan in 4 weeks at which time a lateral x-ray of the C-spine will be performed. -Therapies recommended SNF, TOC on board, SNF search ongoing. -Pain appears to be controlled on twice daily tramadol and has not required much of as needed meds.  Tramadol is her home medication and less likely to be the cause of her mental status changes.  Acute renal failure complicating underlying possible stage IIIb CKD. - Creatinine 1.69 at time of admission.  Baseline 0.89 from 01/04/2016 and no recent lab work since, even in [employer].  Etiology likely secondary to prerenal azotemia from dehydration after being found down.  CK1 92. -Post IV fluids, creatinine has gradually improved to 1.3.  Possibly close to her baseline.  Check renal ultrasound for completion to rule out hydronephrosis.  DC IV fluids.  Follow BMP in AM.  Dehydration with hyponatremia: Resolved after IV fluids.  However at risk for recurrent dehydration from inconsistent and poor oral intake.  Encourage p.o. fluids and diet.  Acute metabolic encephalopathy Likely due to acute illness, hospitalization, pain, pain meds, AKI, UTI which could be contributing to undiagnosed  age-related cognitive impairment/dementia and B12 deficiency.  Delirium cautions.  Minimize sedatives and opioids.  Tele sitter ordered for safety.  TSH normal.  B12 low, started parenteral  B12 shots x5 days.  Treating UTI as below.  Mental status much improved.  Monitor.  E. coli acute cystitis: Urine culture shows >100 K colonies of E. coli.  Continue IV ceftriaxone pending urine culture results.  B12 deficiency: Follow MMA results which may not be available for several days.  Started vitamin B12 IM daily x5 days  Vaginal candidiasis Fluconazole 150 mg x 1 on 6/30.  Essential hypertension Controlled on Toprol-XL 25 Mg daily.  HLD:  Simvastatin 20 mg p.o. daily  Anxiety:  Patient chronically on lorazepam 2 mg at bedtime, here on 1 mg at bedtime.  Continue same and defer to outpatient follow-up regarding gradual taper to wean off.  Advanced degenerative joint disease Severe tricompartmental disease noted on left knee with advanced degenerative disease noted on right.  On tramadol 50 mg p.o. twice daily at home. --Continue Tylenol, home tramadol, as needed Norco  Acute urinary retention: S/p in and out catheterization x2 on 6/28.  Per RN report, since then has been voiding without difficulty.   DVT prophylaxis: SCDs Start: 07/13/20 2003   Code Status: Full Code Family Communication: Discussed in detail with patient's son on 7/1 who is a Public librarian and [place].  Updated care and answered all questions and advised him that patient would like to be discharged to SNF in the next 1 to 2 days pending further improvement.  Remains inpatient appropriate because:Unsafe d/c plan, IV treatments appropriate due to intensity of illness or inability to take PO, and Inpatient level of care appropriate due to severity of illness  Dispo: The patient is from: Home              Anticipated d/c is to: SNF hopefully in the next 1 to 2 days.              Patient currently is not medically stable to d/c.   Difficult to place patient No  Consultants:  Neurosurgery Neurology  Procedures:  In and out catheterization  Antimicrobials:  IV ceftriaxone 6/30  >   Subjective: Patient sitting up in the chair.  Looks much better.  Much more coherent.  Alert and oriented to self, place, partly to time (June 2022).  Able to tell that she has a single son who works as a Public librarian at a place close to [employer].  No visual hallucinations today.  Nursing requested removal of telemetry sitter.  Objective: Vitals:   07/17/20 0408 07/17/20 0822 07/17/20 1156 07/17/20 1647  BP: 136/72 (!) 146/84 (!) 126/55 140/73  Pulse: 79 85 83 85  Resp: 17 18 18 16   Temp: 98 F (36.7 C) 98 F (36.7 C) 98.2 F (36.8 C) 98.5 F (36.9 C)  TempSrc: Oral Oral Oral Oral  SpO2: 95% 97% 96% 98%  Weight:      Height:        Intake/Output Summary (Last 24 hours) at 07/17/2020 1712 Last data filed at 07/17/2020 0343 Gross per 24 hour  Intake 284.34 ml  Output --  Net 284.34 ml   Filed Weights   07/13/20 1554  Weight: 49.9 kg    Examination:  General exam: Pleasant elderly female, moderately built and frail, sitting up comfortably in chair. Neck: Has hard cervical collar in place. Respiratory system: Clear to auscultation.  No increased work of breathing. Cardiovascular system:  S1 and S2 heard, RRR.  No JVD, murmurs or pedal edema.  Short systolic murmur 2/6 best heard at apex. Gastrointestinal system: Abdomen is nondistended, soft and nontender. No organomegaly or masses felt. Normal bowel sounds heard. Central nervous system: Alert and oriented as noted above.  No focal deficits. Extremities: Symmetric 5 x 5 power. Skin: No rashes, lesions or ulcers Psychiatry: Judgement and insight improved.  Mood & affect appropriate and much more coherent today.     Data Reviewed: I have personally reviewed following labs and imaging studies  CBC: Recent Labs  Lab 07/13/20 1607  WBC 9.9  NEUTROABS 8.4*  HGB 12.2  HCT 37.8  MCV 96.2  PLT 217   Basic Metabolic Panel: Recent Labs  Lab 07/13/20 1636 07/15/20 0250 07/16/20 0432 07/17/20 0245  NA 136 133* 129*  134*  K 3.8 3.7 3.9 3.6  CL 105 102 96* 103  CO2 23 24 25 24   GLUCOSE 126* 125* 92 98  BUN 30* 25* 21 21  CREATININE 1.69* 1.46* 1.45* 1.36*  CALCIUM 8.6* 8.6* 8.5* 8.1*   GFR: Estimated Creatinine Clearance: 20.3 mL/min (A) (by C-G formula based on SCr of 1.36 mg/dL (H)). Liver Function Tests: Recent Labs  Lab 07/13/20 1636  AST 19  ALT 8  ALKPHOS 110  BILITOT 1.7*  PROT 6.6  ALBUMIN 3.4*    Cardiac Enzymes: Recent Labs  Lab 07/13/20 1636  CKTOTAL 192    CBG: Recent Labs  Lab 07/13/20 2231  GLUCAP 112*    Recent Results (from the past 240 hour(s))  Resp Panel by RT-PCR (Flu A&B, Covid) Nasopharyngeal Swab     Status: None   Collection Time: 07/13/20  5:46 PM   Specimen: Nasopharyngeal Swab; Nasopharyngeal(NP) swabs in vial transport medium  Result Value Ref Range Status   SARS Coronavirus 2 by RT PCR NEGATIVE NEGATIVE Final    Comment: (NOTE) SARS-CoV-2 target nucleic acids are NOT DETECTED.  The SARS-CoV-2 RNA is generally detectable in upper respiratory specimens during the acute phase of infection. The lowest concentration of SARS-CoV-2 viral copies this assay can detect is 138 copies/mL. A negative result does not preclude SARS-Cov-2 infection and should not be used as the sole basis for treatment or other patient management decisions. A negative result may occur with  improper specimen collection/handling, submission of specimen other than nasopharyngeal swab, presence of viral mutation(s) within the areas targeted by this assay, and inadequate number of viral copies(<138 copies/mL). A negative result must be combined with clinical observations, patient history, and epidemiological information. The expected result is Negative.  Fact Sheet for Patients:  07/15/20  Fact Sheet for Healthcare Providers:  BloggerCourse.com  This test is no t yet approved or cleared by the [place] FDA and   has been authorized for detection and/or diagnosis of SARS-CoV-2 by FDA under an Emergency Use Authorization (EUA). This EUA will remain  in effect (meaning this test can be used) for the duration of the COVID-19 declaration under Section 564(b)(1) of the Act, 21 U.S.C.section 360bbb-3(b)(1), unless the authorization is terminated  or revoked sooner.       Influenza A by PCR NEGATIVE NEGATIVE Final   Influenza B by PCR NEGATIVE NEGATIVE Final    Comment: (NOTE) The Xpert Xpress SARS-CoV-2/FLU/RSV plus assay is intended as an aid in the diagnosis of influenza from Nasopharyngeal swab specimens and should not be used as a sole basis for treatment. Nasal washings and aspirates are unacceptable for Xpert Xpress SARS-CoV-2/FLU/RSV testing.  Fact  Sheet for Patients: BloggerCourse.comhttps://www.fda.gov/media/152166/download  Fact Sheet for Healthcare Providers: SeriousBroker.ithttps://www.fda.gov/media/152162/download  This test is not yet approved or cleared by the [place] FDA and has been authorized for detection and/or diagnosis of SARS-CoV-2 by FDA under an Emergency Use Authorization (EUA). This EUA will remain in effect (meaning this test can be used) for the duration of the COVID-19 declaration under Section 564(b)(1) of the Act, 21 U.S.C. section 360bbb-3(b)(1), unless the authorization is terminated or revoked.  Performed at [facility], [address] [place], [place] [address]   Culture, Urine     Status: Abnormal (Preliminary result)   Collection Time: 07/16/20  2:08 PM   Specimen: Urine, Random  Result Value Ref Range Status   Specimen Description URINE, RANDOM  Final   Special Requests   Final    NONE Performed at [facility], [address] [place], [place] [address]    Culture >=100,000 COLONIES/mL ESCHERICHIA COLI (A)  Final   Report Status PENDING  Incomplete         Radiology Studies: MR BRAIN WO CONTRAST  Result Date: 07/15/2020 CLINICAL DATA:   Found down.  Confusion. EXAM: MRI HEAD WITHOUT CONTRAST TECHNIQUE: Multiplanar, multiecho pulse sequences of the brain and surrounding structures were obtained without intravenous contrast. COMPARISON:  None. FINDINGS: Brain: No acute infarct, mass effect or extra-axial collection. No acute or chronic hemorrhage. Normal white matter signal, parenchymal volume and CSF spaces. The midline structures are normal. Vascular: Major flow voids are preserved. Skull and upper cervical spine: Normal calvarium and skull base. Visualized upper cervical spine and soft tissues are normal. Sinuses/Orbits:No paranasal sinus fluid levels or advanced mucosal thickening. No mastoid or middle ear effusion. Normal orbits. IMPRESSION: Normal aging brain. Electronically Signed   By: Deatra RobinsonKevin  Herman M.D.   On: 07/15/2020 23:32   EEG adult  Result Date: 07/16/2020 Charlsie QuestYadav, Priyanka O, MD     07/16/2020 12:04 PM Patient Name: Diane Nolan MRN: [id number] Epilepsy Attending: Charlsie QuestPriyanka O Yadav Referring Physician/Provider: Jimmye NormanKaren Kirby-Graham, NP Date: 07/16/2020 Duration: 23.30 mins Patient history: 87 year old female with unwitnessed fall and confusion.  EEG to evaluate for seizures. Level of alertness: Awake AEDs during EEG study: Ativan Technical aspects: This EEG study was done with scalp electrodes positioned according to the 10-20 International system of electrode placement. Electrical activity was acquired at a sampling rate of 500Hz  and reviewed with a high frequency filter of 70Hz  and a low frequency filter of 1Hz . EEG data were recorded continuously and digitally stored. Description: The posterior dominant rhythm consists of 8Hz  activity of moderate voltage (25-35 uV) seen predominantly in posterior head regions, symmetric and reactive to eye opening and eye closing. Hyperventilation and photic stimulation were not performed.   IMPRESSION: This study is within normal limits. No seizures or epileptiform discharges were seen throughout the  recording. Priyanka Annabelle Harman Yadav        Scheduled Meds:  cyanocobalamin  1,000 mcg Intramuscular Daily   LORazepam  1 mg Oral QHS   metoprolol succinate  25 mg Oral Daily   simvastatin  20 mg Oral Daily   traMADol  50 mg Oral BID   Continuous Infusions:  sodium chloride 75 mL/hr at 07/17/20 0343   cefTRIAXone (ROCEPHIN)  IV 1 g (07/17/20 1546)     LOS: 4 days    Marcellus ScottAnand Mckyle Solanki, MD, [place]FACP, [facility]. [facility]  To contact the attending provider between 7A-7P or the covering provider during after hours 7P-7A, please log into the web site  [email or web address] and access using universal [place] password for that web site. If you do not have the password, please call the hospital operator.

## 2020-07-17 NOTE — TOC Progression Note (Signed)
Transition of Care [facility]) - Progression Note    Patient Details  Name: Diane Nolan MRN: [id number] Date of Birth: 01-05-1929  [facility] CM/SW Contact  Kermit Balo, RN Phone Number: 07/17/2020, 1:05 PM  Clinical Narrative:    Pts son chose [facility] as first choice but they were unable to offer a bed. [facility] the second choice has offered a bed and hope to have one available tomorrow.  Pt had tele sitter overnight. Was d/ced this am so will need 24 hours without a sitter for d/c to SNF.  Pt has auth through [employer]: SNF auth approved 7/1 - 7/5, with next review date 7/5.  Reference: [id number] Son updated and in agreement with plan.  Covid ordered for potential d/c to rehab tomorrow. TOC following.   Expected Discharge Plan: Skilled Nursing Facility Barriers to Discharge: Continued Medical Work up  Expected Discharge Plan and Services Expected Discharge Plan: Skilled Nursing Facility In-house Referral: Clinical Social Work Discharge Planning Services: CM Consult Post Acute Care Choice: Skilled Nursing Facility Living arrangements for the past 2 months: Single Family Home                                       Social Determinants of Health (SDOH) Interventions    Readmission Risk Interventions No flowsheet data found.

## 2020-07-17 NOTE — Care Management Important Message (Signed)
Important Message  Patient Details  Name: Diane Nolan MRN: [id number] Date of Birth: [birth year]   Medicare Important Message Given:  Yes - Important Message mailed due to current National Emergency   Verbal consent obtained due to current National Emergency  Relationship to patient: Self Contact Name: Vianka Call Date: 07/17/20  Time: 1103 Phone: [phone] Outcome: No Answer/Busy Important Message mailed to: Patient address on file    Orson Aloe 07/17/2020, 11:03 AM

## 2020-07-18 ENCOUNTER — Inpatient Hospital Stay (HOSPITAL_COMMUNITY): Payer: Medicare Other

## 2020-07-18 LAB — COMPREHENSIVE METABOLIC PANEL
ALT: 10 U/L (ref 0–44)
AST: 12 U/L — ABNORMAL LOW (ref 15–41)
Albumin: 2.5 g/dL — ABNORMAL LOW (ref 3.5–5.0)
Alkaline Phosphatase: 94 U/L (ref 38–126)
Anion gap: 11 (ref 5–15)
BUN: 19 mg/dL (ref 8–23)
CO2: 22 mmol/L (ref 22–32)
Calcium: 8.6 mg/dL — ABNORMAL LOW (ref 8.9–10.3)
Chloride: 100 mmol/L (ref 98–111)
Creatinine, Ser: 1.32 mg/dL — ABNORMAL HIGH (ref 0.44–1.00)
GFR, Estimated: 38 mL/min — ABNORMAL LOW (ref >60)
Glucose, Bld: 74 mg/dL (ref 70–99)
Potassium: 3.6 mmol/L (ref 3.5–5.1)
Sodium: 133 mmol/L — ABNORMAL LOW (ref 135–145)
Total Bilirubin: 1.1 mg/dL (ref 0.3–1.2)
Total Protein: 5.3 g/dL — ABNORMAL LOW (ref 6.5–8.1)

## 2020-07-18 LAB — URINE CULTURE: Culture: >=100000 — AB

## 2020-07-18 NOTE — Progress Notes (Signed)
PROGRESS NOTE    Diane Nolan  ZOX:[id number] DOB: 09/27/1928 DOA: 07/13/2020 PCP: Burton Apleyoberts, Ronald, MD    Brief Narrative:  Diane Nolan is a 87 year old female with past medical history significant for essential hypertension, type 2 diabetes mellitus who presented to [facility] via EMS after being found down at home following an unwitnessed fall.  Patient was found by her son who was coming over to pick her up for a doctor's appointment; for apparently recurrent falls.  In the ED, her 98.4 F, HR 84, RR 16, BP 118/64, SPO2 100% on room air.  WBC 9.9, hemoglobin 12.2, platelets 217.  Sodium 136, potassium 3.8, chloride 105, CO2 23, glucose 126, BUN 30, creatinine 1.69.  AST 19, ALT 8, total bilirubin 1.7.  COVID-19 PCR negative.  Influenza A/B PCR negative.  CT head with no acute intercranial abnormality.  CT C-spine with fracture through the base of the odontoid with minimal posterior displacement with respect to the body of C2, no other focal fracture noted.  Chest x-ray with no acute cardiopulmonary disease process, noted aortic atherosclerosis.  Left knee x-ray with severe tricompartmental degenerative arthritis, no acute fracture/dislocation.  Right knee x-ray with advanced degenerative changes, no acute bony abnormality.  Pelvis x-ray, no acute bony abnormality.  Neurosurgery consulted.  TRH consulted for further evaluation and management of acute C2 fracture with associated recurrent falls and acute renal failure.  [facility].  Neurosurgery recommends no surgical intervention for type II odontoid fracture, should wear hard collar at all times, outpatient follow-up with Dr. Lovell SheehanJenkins in 4 weeks.  At family's request, Neurology consulted on 6/29 for evaluation of confusion.  Medically stable for DC on 7/2 but as per TOC, SNF will not have a bed until 7/4.   Assessment & Plan:   Principal Problem:   Odontoid fracture with type II  morphology and posterior displacement (HCC) Active Problems:   Renal insufficiency   Acute encephalopathy   Odontoid fracture with type II morphology and posterior displacement Presented from home after being found down for unknown time by son. Patient with history of recurrent falls.  CT head unrevealing and CT C-spine with odontoid fracture with minimal posterior displacement. --Neurosurgery evaluation/recommendations: Seen on 6/28.  Indicates that she has a type II odontoid fracture following a fall for which no surgical intervention is recommended, should wear hard cervical collar at all times and goal is for the fracture to heal with time, outpatient follow-up with Dr. Lovell SheehanJenkins in 4 weeks at which time a lateral x-ray of the C-spine will be performed. -Therapies recommended SNF, TOC on board, SNF search ongoing. -Pain appears to be controlled on twice daily tramadol and has not required much of as needed meds.  Tramadol is her home medication and less likely to be the cause of her mental status changes.  Acute renal failure complicating underlying possible stage IIIb CKD. - Creatinine 1.69 at time of admission.  Baseline 0.89 from 01/04/2016 and no recent lab work since, even in [facility].  Etiology likely secondary to prerenal azotemia from dehydration after being found down.  CK1 92. -Post IV fluids, creatinine has gradually improved to 1.3, likely at her baseline.  Check renal ultrasound for completion to rule out hydronephrosis.  AKI resolved.  Dehydration with hyponatremia: Resolved after IV fluids.  However at risk for recurrent dehydration from inconsistent and poor oral intake.  Encourage p.o. fluids and diet.  Acute metabolic encephalopathy Likely due to acute illness, hospitalization,  pain, pain meds, AKI, UTI which could be contributing to undiagnosed age-related cognitive impairment/dementia and B12 deficiency.  Delirium cautions.  Minimize sedatives and opioids.  Tele  sitter ordered for safety.  TSH normal.  B12 low, started parenteral B12 shots x5 days.  Treating UTI as below.  Resolved.  E. coli acute cystitis: Urine culture shows >100 K colonies of E. coli, sensitive to ceftriaxone.  Complete 3 days of IV ceftriaxone and discontinue.  B12 deficiency: Follow MMA results which may not be available for several days-still pending.  Started vitamin B12 IM daily x5 days  Vaginal candidiasis Fluconazole 150 mg x 1 on 6/30.  Essential hypertension Controlled on Toprol-XL 25 Mg daily.  HLD:  Simvastatin 20 mg p.o. daily  Anxiety:  Patient chronically on lorazepam 2 mg at bedtime, here on 1 mg at bedtime.  Continue same and defer to outpatient follow-up regarding gradual taper to wean off.  Advanced degenerative joint disease Severe tricompartmental disease noted on left knee with advanced degenerative disease noted on right.  On tramadol 50 mg p.o. twice daily at home. --Continue Tylenol, home tramadol, as needed Norco  Acute urinary retention: S/p in and out catheterization x2 on 6/28.  Resolved.   DVT prophylaxis: SCDs Start: 07/13/20 2003   Code Status: Full Code Family Communication: Discussed in detail with patient's son on 7/1 at bedside, updated care and answered questions and advised him of her stability for DC to SNF when bed available.  Remains inpatient appropriate because:Unsafe d/c plan, IV treatments appropriate due to intensity of illness or inability to take PO, and Inpatient level of care appropriate due to severity of illness  Dispo: The patient is from: Home              Anticipated d/c is to: Medically stable for DC when SNF bed available.              Patient currently stable for DC.   Difficult to place patient No  Consultants:  Neurosurgery Neurology  Procedures:  In and out catheterization  Antimicrobials:  IV ceftriaxone 6/30 >   Subjective: Denies complaints.  No pain reported.  Son at bedside feels that her  mental status is back to her baseline.  Objective: Vitals:   07/17/20 2331 07/18/20 0443 07/18/20 0821 07/18/20 1115  BP: (!) 134/53 135/65 134/60 118/63  Pulse: 73 83 87 83  Resp: Temp: 98.1 F (36.7 C) 98.2 F (36.8 C) 98.5 F (36.9 C) 98.4 F (36.9 C)  TempSrc: Oral Oral Oral Oral  SpO2: 98% 97% 99% 97%  Weight:      Height:        Intake/Output Summary (Last 24 hours) at 07/18/2020 1519 Last data filed at 07/17/2020 2211 Gross per 24 hour  Intake 120 ml  Output --  Net 120 ml   Filed Weights   07/13/20 1554  Weight: 49.9 kg    Examination:  General exam: Pleasant elderly female, moderately built and frail, sitting up comfortably in chair. Neck: Has hard cervical collar in place. Respiratory system: Clear to auscultation.  No increased work of breathing. Cardiovascular system: S1 and S2 heard, RRR.  No JVD, murmurs or pedal edema.  Short systolic murmur 2/6 best heard at apex. Gastrointestinal system: Abdomen is nondistended, soft and nontender. No organomegaly or masses felt. Normal bowel sounds heard. Central nervous system: Alert and oriented.  Quite coherent.  Had long discussion with her and her son at bedside about various  things. Extremities: Symmetric 5 x 5 power. Skin: No rashes, lesions or ulcers Psychiatry: Judgement and insight improved.  Mood & affect appropriate and much more coherent today.     Data Reviewed: I have personally reviewed following labs and imaging studies  CBC: Recent Labs  Lab 07/13/20 1607  WBC 9.9  NEUTROABS 8.4*  HGB 12.2  HCT 37.8  MCV 96.2  PLT 217   Basic Metabolic Panel: Recent Labs  Lab 07/13/20 1636 07/15/20 0250 07/16/20 0432 07/17/20 0245 07/18/20 0359  NA 136 133* 129* 134* 133*  K 3.8 3.7 3.9 3.6 3.6  CL 105 102 96* 103 100  CO2 23 24 25 24 22   GLUCOSE 126* 125* 92 98 74  BUN 30* 25* 21 21 19   CREATININE 1.69* 1.46* 1.45* 1.36* 1.32*  CALCIUM 8.6* 8.6* 8.5* 8.1* 8.6*   GFR: Estimated  Creatinine Clearance: 20.9 mL/min (A) (by C-G formula based on SCr of 1.32 mg/dL (H)). Liver Function Tests: Recent Labs  Lab 07/13/20 1636 07/18/20 0359  AST 19 12*  ALT 8 10  ALKPHOS 110 94  BILITOT 1.7* 1.1  PROT 6.6 5.3*  ALBUMIN 3.4* 2.5*    Cardiac Enzymes: Recent Labs  Lab 07/13/20 1636  CKTOTAL 192    CBG: Recent Labs  Lab 07/13/20 2231  GLUCAP 112*    Recent Results (from the past 240 hour(s))  Resp Panel by RT-PCR (Flu A&B, Covid) Nasopharyngeal Swab     Status: None   Collection Time: 07/13/20  5:46 PM   Specimen: Nasopharyngeal Swab; Nasopharyngeal(NP) swabs in vial transport medium  Result Value Ref Range Status   SARS Coronavirus 2 by RT PCR NEGATIVE NEGATIVE Final    Comment: (NOTE) SARS-CoV-2 target nucleic acids are NOT DETECTED.  The SARS-CoV-2 RNA is generally detectable in upper respiratory specimens during the acute phase of infection. The lowest concentration of SARS-CoV-2 viral copies this assay can detect is 138 copies/mL. A negative result does not preclude SARS-Cov-2 infection and should not be used as the sole basis for treatment or other patient management decisions. A negative result may occur with  improper specimen collection/handling, submission of specimen other than nasopharyngeal swab, presence of viral mutation(s) within the areas targeted by this assay, and inadequate number of viral copies(<138 copies/mL). A negative result must be combined with clinical observations, patient history, and epidemiological information. The expected result is Negative.  Fact Sheet for Patients:  07/15/20  Fact Sheet for Healthcare Providers:  07/15/20  This test is no t yet approved or cleared by the [employer] FDA and  has been authorized for detection and/or diagnosis of SARS-CoV-2 by FDA under an Emergency Use Authorization (EUA). This EUA will remain  in effect  (meaning this test can be used) for the duration of the COVID-19 declaration under Section 564(b)(1) of the Act, 21 U.S.C.section 360bbb-3(b)(1), unless the authorization is terminated  or revoked sooner.       Influenza A by PCR NEGATIVE NEGATIVE Final   Influenza B by PCR NEGATIVE NEGATIVE Final    Comment: (NOTE) The Xpert Xpress SARS-CoV-2/FLU/RSV plus assay is intended as an aid in the diagnosis of influenza from Nasopharyngeal swab specimens and should not be used as a sole basis for treatment. Nasal washings and aspirates are unacceptable for Xpert Xpress SARS-CoV-2/FLU/RSV testing.  Fact Sheet for Patients: SeriousBroker.it  Fact Sheet for Healthcare Providers: [place]  This test is not yet approved or cleared by the [employer] FDA and has been authorized for detection and/or  diagnosis of SARS-CoV-2 by FDA under an Emergency Use Authorization (EUA). This EUA will remain in effect (meaning this test can be used) for the duration of the COVID-19 declaration under Section 564(b)(1) of the Act, 21 U.S.C. section 360bbb-3(b)(1), unless the authorization is terminated or revoked.  Performed at [facility], [address] [place], [place] [address]   Culture, Urine     Status: Abnormal   Collection Time: 07/16/20  2:08 PM   Specimen: Urine, Random  Result Value Ref Range Status   Specimen Description URINE, RANDOM  Final   Special Requests   Final    NONE Performed at [facility], [address] [place], [place] [address]    Culture >=100,000 COLONIES/mL ESCHERICHIA COLI (A)  Final   Report Status 07/18/2020 FINAL  Final   Organism ID, Bacteria ESCHERICHIA COLI (A)  Final      Susceptibility   Escherichia coli - MIC*    AMPICILLIN RESISTANT Resistant     CEFAZOLIN <=4 SENSITIVE Sensitive     CEFEPIME <=0.12 SENSITIVE Sensitive     CEFTRIAXONE 0.5 SENSITIVE Sensitive      CIPROFLOXACIN <=0.25 SENSITIVE Sensitive     GENTAMICIN <=1 SENSITIVE Sensitive     IMIPENEM <=0.25 SENSITIVE Sensitive     NITROFURANTOIN <=16 SENSITIVE Sensitive     TRIMETH/SULFA <=20 SENSITIVE Sensitive     AMPICILLIN/SULBACTAM 4 SENSITIVE Sensitive     PIP/TAZO <=4 SENSITIVE Sensitive     * >=100,000 COLONIES/mL ESCHERICHIA COLI  SARS CORONAVIRUS 2 (TAT 6-24 HRS) Nasopharyngeal Nasopharyngeal Swab     Status: None   Collection Time: 07/17/20 10:42 AM   Specimen: Nasopharyngeal Swab  Result Value Ref Range Status   SARS Coronavirus 2 NEGATIVE NEGATIVE Final    Comment: (NOTE) SARS-CoV-2 target nucleic acids are NOT DETECTED.  The SARS-CoV-2 RNA is generally detectable in upper and lower respiratory specimens during the acute phase of infection. Negative results do not preclude SARS-CoV-2 infection, do not rule out co-infections with other pathogens, and should not be used as the sole basis for treatment or other patient management decisions. Negative results must be combined with clinical observations, patient history, and epidemiological information. The expected result is Negative.  Fact Sheet for Patients: HairSlick.no  Fact Sheet for Healthcare Providers: quierodirigir.com  This test is not yet approved or cleared by the [place] FDA and  has been authorized for detection and/or diagnosis of SARS-CoV-2 by FDA under an Emergency Use Authorization (EUA). This EUA will remain  in effect (meaning this test can be used) for the duration of the COVID-19 declaration under Se ction 564(b)(1) of the Act, 21 U.S.C. section 360bbb-3(b)(1), unless the authorization is terminated or revoked sooner.  Performed at [facility], [address] [place], [place] [address]          Radiology Studies: No results found.      Scheduled Meds:  cyanocobalamin  1,000 mcg Intramuscular Daily   LORazepam  1 mg Oral  QHS   metoprolol succinate  25 mg Oral Daily   simvastatin  20 mg Oral Daily   traMADol  50 mg Oral BID   Continuous Infusions:  cefTRIAXone (ROCEPHIN)  IV 1 g (07/17/20 1546)     LOS: 5 days    Marcellus Scott, MD, [place], [facility]. [facility]  To contact the attending provider between 7A-7P or the covering provider during after hours 7P-7A, please log into the web site [email or web address] and access using universal [facility] password for that web site. If you do not have the password, please call the hospital operator.

## 2020-07-18 NOTE — TOC Progression Note (Signed)
Transition of Care [facility]) - Progression Note    Patient Details  Name: Diane Nolan MRN: [id number] Date of Birth: 10/01/1928  [facility] of [facility] CM/SW Contact  Carley Hammed, LCSWA Phone Number: 07/18/2020, 10:01 AM  Clinical Narrative:    CSW spoke with [facility] and they noted they will not have a bed over the weekend. AF stated they may have a bed Monday Morning., but it would need to be an early DC. CSW will attempt to have everything ready for pt to DC Monday morning.   Expected Discharge Plan: Skilled Nursing Facility Barriers to Discharge: Continued Medical Work up  Expected Discharge Plan and Services Expected Discharge Plan: Skilled Nursing Facility In-house Referral: Clinical Social Work Discharge Planning Services: CM Consult Post Acute Care Choice: Skilled Nursing Facility Living arrangements for the past 2 months: Single Family Home                                       Social Determinants of Health (SDOH) Interventions    Readmission Risk Interventions No flowsheet data found.

## 2020-07-18 NOTE — Plan of Care (Signed)

## 2020-07-19 NOTE — Progress Notes (Signed)
Even PROGRESS NOTE    [name]  ZWC:[id number] DOB: 11-29-1928 DOA: 07/13/2020 PCP: Burton Apley, MD    Brief Narrative:  Diane Nolan is a 87 year old female with past medical history significant for essential hypertension, type 2 diabetes mellitus who presented to [facility] via EMS after being found down at home following an unwitnessed fall.  Patient was found by her son who was coming over to pick her up for a doctor's appointment; for apparently recurrent falls.  In the ED, her 98.4 F, HR 84, RR 16, BP 118/64, SPO2 100% on room air.  WBC 9.9, hemoglobin 12.2, platelets 217.  Sodium 136, potassium 3.8, chloride 105, CO2 23, glucose 126, BUN 30, creatinine 1.69.  AST 19, ALT 8, total bilirubin 1.7.  COVID-19 PCR negative.  Influenza A/B PCR negative.  CT head with no acute intercranial abnormality.  CT C-spine with fracture through the base of the odontoid with minimal posterior displacement with respect to the body of C2, no other focal fracture noted.  Chest x-ray with no acute cardiopulmonary disease process, noted aortic atherosclerosis.  Left knee x-ray with severe tricompartmental degenerative arthritis, no acute fracture/dislocation.  Right knee x-ray with advanced degenerative changes, no acute bony abnormality.  Pelvis x-ray, no acute bony abnormality.  Neurosurgery consulted.  TRH consulted for further evaluation and management of acute C2 fracture with associated recurrent falls and acute renal failure.  Patient was transferred to [facility] for neurosurgical evaluation.  Neurosurgery recommends no surgical intervention for type II odontoid fracture, should wear hard collar at all times, outpatient follow-up with Dr. Lovell Sheehan in 4 weeks.  At family's request, Neurology consulted on 6/29 for evaluation of confusion.  Medically stable for DC on 7/2 but as per TOC, SNF will not have a bed until 7/4.   Assessment & Plan:   Principal Problem:   Odontoid fracture with type II  morphology and posterior displacement (HCC) Active Problems:   Renal insufficiency   Acute encephalopathy   Odontoid fracture with type II morphology and posterior displacement Presented from home after being found down for unknown time by son. Patient with history of recurrent falls.  CT head unrevealing and CT C-spine with odontoid fracture with minimal posterior displacement. --Neurosurgery evaluation/recommendations: Seen on 6/28.  Indicates that she has a type II odontoid fracture following a fall for which no surgical intervention is recommended, should wear hard cervical collar at all times and goal is for the fracture to heal with time, outpatient follow-up with Dr. Lovell Sheehan in 4 weeks at which time a lateral x-ray of the C-spine will be performed. -Therapies recommended SNF, TOC on board, SNF search ongoing. -Pain appears to be controlled on twice daily tramadol and has not required much of as needed meds.  Tramadol is her home medication and less likely to be the cause of her mental status changes.  Acute renal failure complicating underlying possible stage IIIb CKD. - Creatinine 1.69 at time of admission.  Baseline 0.89 from 01/04/2016 and no recent lab work since, even in [employer].  Etiology likely secondary to prerenal azotemia from dehydration after being found down.  CK1 92. -Post IV fluids, creatinine has gradually improved to 1.3, likely at her baseline.  Check renal ultrasound for completion to rule out hydronephrosis.  AKI resolved.  Dehydration with hyponatremia: Resolved after IV fluids.  However at risk for recurrent dehydration from inconsistent and poor oral intake.  Encourage p.o. fluids and diet.  Acute metabolic encephalopathy Likely due to acute illness,  hospitalization, pain, pain meds, AKI, UTI which could be contributing to undiagnosed age-related cognitive impairment/dementia and B12 deficiency.  Delirium cautions.  Minimize sedatives and opioids.  Tele  sitter ordered for safety.  TSH normal.  B12 low, started parenteral B12 shots x5 days.  Treating UTI as below.  Resolved.  Per nursing, confusion during the/night hours, suspect sundowning.  Coherent again this morning.  E. coli acute cystitis: Urine culture shows >100 K colonies of E. coli, sensitive to ceftriaxone.  Completed 3 days course of IV ceftriaxone.  B12 deficiency: Follow MMA results which may not be available for several days-still pending.  Started vitamin B12 IM daily x5 days  Vaginal candidiasis Fluconazole 150 mg x 1 on 6/30.  Essential hypertension Controlled on Toprol-XL 25 Mg daily.  HLD:  Simvastatin 20 mg p.o. daily  Anxiety:  Patient chronically on lorazepam 2 mg at bedtime, here on 1 mg at bedtime.  Continue same and defer to outpatient follow-up regarding gradual taper to wean off.  Advanced degenerative joint disease Severe tricompartmental disease noted on left knee with advanced degenerative disease noted on right.  On tramadol 50 mg p.o. twice daily at home. --Continue Tylenol, home tramadol, as needed Norco  Acute urinary retention: S/p in and out catheterization x2 on 6/28.  Resolved.   DVT prophylaxis: SCDs Start: 07/13/20 2003   Code Status: Full Code Family Communication: Discussed in detail with patient's son on 7/2 at bedside, updated care and answered questions and advised him of her stability for DC to SNF when bed available.  Remains inpatient appropriate because:Unsafe d/c plan, IV treatments appropriate due to intensity of illness or inability to take PO, and Inpatient level of care appropriate due to severity of illness  Dispo: The patient is from: Home              Anticipated d/c is to: Medically stable for DC when SNF bed available.  Will request repeat COVID-19 testing.              Patient currently stable for DC.   Difficult to place patient No  Consultants:  Neurosurgery Neurology  Procedures:  In and out  catheterization  Antimicrobials:  IV ceftriaxone 6/30 >   Subjective: No complaints reported.  No pain.  Objective: Vitals:   07/18/20 2001 07/19/20 0023 07/19/20 0338 07/19/20 1016  BP: 134/60 (!) 160/75 (!) 153/78 (!) 149/80  Pulse: 85 76 93 88  Resp: Temp: 97.7 F (36.5 C) 98.2 F (36.8 C) 98 F (36.7 C) 98 F (36.7 C)  TempSrc: Oral Oral Oral   SpO2: 97% 96% 99% 97%  Weight:      Height:        Intake/Output Summary (Last 24 hours) at 07/19/2020 1236 Last data filed at 07/18/2020 2206 Gross per 24 hour  Intake 120 ml  Output --  Net 120 ml   Filed Weights   07/13/20 1554  Weight: 49.9 kg    Examination:  General exam: Pleasant elderly female, moderately built and frail, lying propped up in bed without distress. Neck: Has hard cervical collar in place. Respiratory system: Clear to auscultation.  No increased work of breathing. Cardiovascular system: S1 and S2 heard, RRR.  No JVD, murmurs or pedal edema.  Short systolic murmur 2/6 best heard at apex. Gastrointestinal system: Abdomen is nondistended, soft and nontender. No organomegaly or masses felt. Normal bowel sounds heard. Central nervous system: Alert and oriented.  No focal deficits.. Extremities: Symmetric  5 x 5 power. Skin: No rashes, lesions or ulcers Psychiatry: Judgement and insight improved but still somewhat impaired.  Mood & affect appropriate and much more coherent today.     Data Reviewed: I have personally reviewed following labs and imaging studies  CBC: Recent Labs  Lab 07/13/20 1607  WBC 9.9  NEUTROABS 8.4*  HGB 12.2  HCT 37.8  MCV 96.2  PLT 217   Basic Metabolic Panel: Recent Labs  Lab 07/13/20 1636 07/15/20 0250 07/16/20 0432 07/17/20 0245 07/18/20 0359  NA 136 133* 129* 134* 133*  K 3.8 3.7 3.9 3.6 3.6  CL 105 102 96* 103 100  CO2 23 24 25 24 22   GLUCOSE 126* 125* 92 98 74  BUN 30* 25* 21 21 19   CREATININE 1.69* 1.46* 1.45* 1.36* 1.32*  CALCIUM 8.6* 8.6*  8.5* 8.1* 8.6*   GFR: Estimated Creatinine Clearance: 20.9 mL/min (A) (by C-G formula based on SCr of 1.32 mg/dL (H)). Liver Function Tests: Recent Labs  Lab 07/13/20 1636 07/18/20 0359  AST 19 12*  ALT 8 10  ALKPHOS 110 94  BILITOT 1.7* 1.1  PROT 6.6 5.3*  ALBUMIN 3.4* 2.5*    Cardiac Enzymes: Recent Labs  Lab 07/13/20 1636  CKTOTAL 192    CBG: Recent Labs  Lab 07/13/20 2231  GLUCAP 112*    Recent Results (from the past 240 hour(s))  Resp Panel by RT-PCR (Flu A&B, Covid) Nasopharyngeal Swab     Status: None   Collection Time: 07/13/20  5:46 PM   Specimen: Nasopharyngeal Swab; Nasopharyngeal(NP) swabs in vial transport medium  Result Value Ref Range Status   SARS Coronavirus 2 by RT PCR NEGATIVE NEGATIVE Final    Comment: (NOTE) SARS-CoV-2 target nucleic acids are NOT DETECTED.  The SARS-CoV-2 RNA is generally detectable in upper respiratory specimens during the acute phase of infection. The lowest concentration of SARS-CoV-2 viral copies this assay can detect is 138 copies/mL. A negative result does not preclude SARS-Cov-2 infection and should not be used as the sole basis for treatment or other patient management decisions. A negative result may occur with  improper specimen collection/handling, submission of specimen other than nasopharyngeal swab, presence of viral mutation(s) within the areas targeted by this assay, and inadequate number of viral copies(<138 copies/mL). A negative result must be combined with clinical observations, patient history, and epidemiological information. The expected result is Negative.  Fact Sheet for Patients:  BloggerCourse.comhttps://www.fda.gov/media/152166/download  Fact Sheet for Healthcare Providers:  SeriousBroker.ithttps://www.fda.gov/media/152162/download  This test is no t yet approved or cleared by the [place] FDA and  has been authorized for detection and/or diagnosis of SARS-CoV-2 by FDA under an Emergency Use Authorization (EUA). This  EUA will remain  in effect (meaning this test can be used) for the duration of the COVID-19 declaration under Section 564(b)(1) of the Act, 21 U.S.C.section 360bbb-3(b)(1), unless the authorization is terminated  or revoked sooner.       Influenza A by PCR NEGATIVE NEGATIVE Final   Influenza B by PCR NEGATIVE NEGATIVE Final    Comment: (NOTE) The Xpert Xpress SARS-CoV-2/FLU/RSV plus assay is intended as an aid in the diagnosis of influenza from Nasopharyngeal swab specimens and should not be used as a sole basis for treatment. Nasal washings and aspirates are unacceptable for Xpert Xpress SARS-CoV-2/FLU/RSV testing.  Fact Sheet for Patients: BloggerCourse.comhttps://www.fda.gov/media/152166/download  Fact Sheet for Healthcare Providers: SeriousBroker.ithttps://www.fda.gov/media/152162/download  This test is not yet approved or cleared by the [place] FDA and has been authorized for detection  and/or diagnosis of SARS-CoV-2 by FDA under an Emergency Use Authorization (EUA). This EUA will remain in effect (meaning this test can be used) for the duration of the COVID-19 declaration under Section 564(b)(1) of the Act, 21 U.S.C. section 360bbb-3(b)(1), unless the authorization is terminated or revoked.  Performed at [facility], [address], [place], [place] [address]   Culture, Urine     Status: Abnormal   Collection Time: 07/16/20  2:08 PM   Specimen: Urine, Random  Result Value Ref Range Status   Specimen Description URINE, RANDOM  Final   Special Requests   Final    NONE Performed at [facility], [address] [place], [place] [address]    Culture >=100,000 COLONIES/mL ESCHERICHIA COLI (A)  Final   Report Status 07/18/2020 FINAL  Final   Organism ID, Bacteria ESCHERICHIA COLI (A)  Final      Susceptibility   Escherichia coli - MIC*    AMPICILLIN RESISTANT Resistant     CEFAZOLIN <=4 SENSITIVE Sensitive     CEFEPIME <=0.12 SENSITIVE Sensitive     CEFTRIAXONE 0.5  SENSITIVE Sensitive     CIPROFLOXACIN <=0.25 SENSITIVE Sensitive     GENTAMICIN <=1 SENSITIVE Sensitive     IMIPENEM <=0.25 SENSITIVE Sensitive     NITROFURANTOIN <=16 SENSITIVE Sensitive     TRIMETH/SULFA <=20 SENSITIVE Sensitive     AMPICILLIN/SULBACTAM 4 SENSITIVE Sensitive     PIP/TAZO <=4 SENSITIVE Sensitive     * >=100,000 COLONIES/mL ESCHERICHIA COLI  SARS CORONAVIRUS 2 (TAT 6-24 HRS) Nasopharyngeal Nasopharyngeal Swab     Status: None   Collection Time: 07/17/20 10:42 AM   Specimen: Nasopharyngeal Swab  Result Value Ref Range Status   SARS Coronavirus 2 NEGATIVE NEGATIVE Final    Comment: (NOTE) SARS-CoV-2 target nucleic acids are NOT DETECTED.  The SARS-CoV-2 RNA is generally detectable in upper and lower respiratory specimens during the acute phase of infection. Negative results do not preclude SARS-CoV-2 infection, do not rule out co-infections with other pathogens, and should not be used as the sole basis for treatment or other patient management decisions. Negative results must be combined with clinical observations, patient history, and epidemiological information. The expected result is Negative.  Fact Sheet for Patients: HairSlick.no  Fact Sheet for Healthcare Providers: quierodirigir.com  This test is not yet approved or cleared by the [place] FDA and  has been authorized for detection and/or diagnosis of SARS-CoV-2 by FDA under an Emergency Use Authorization (EUA). This EUA will remain  in effect (meaning this test can be used) for the duration of the COVID-19 declaration under Se ction 564(b)(1) of the Act, 21 U.S.C. section 360bbb-3(b)(1), unless the authorization is terminated or revoked sooner.  Performed at [facility], [address] [place], [place] [address]          Radiology Studies: US RENAL  Result Date: 07/19/2020 CLINICAL DATA:  Acute kidney injury EXAM: RENAL /  URINARY TRACT ULTRASOUND COMPLETE COMPARISON:  None. FINDINGS: Right Kidney: Renal measurements: 9.6 x 3.4 x 3.7 cm = volume: 63 mL. There are 2 renal cysts. The larger measures 14 mm. No hydronephrosis. Left Kidney: Renal measurements: 7.7 x 4.1 x 3.9 cm = volume: 66 mL. Mild hydronephrosis. Multiple renal cysts. The larger measures 14 mm. Bladder: Appears normal for degree of bladder distention. Other: None. IMPRESSION: Mild left hydronephrosis. Electronically Signed   By: Deatra Robinson M.D.   On: 07/19/2020 01:03  Scheduled Meds:  cyanocobalamin  1,000 mcg Intramuscular Daily   LORazepam  1 mg Oral QHS   metoprolol succinate  25 mg Oral Daily   simvastatin  20 mg Oral Daily   traMADol  50 mg Oral BID   Continuous Infusions:     LOS: 6 days    Marcellus Scott, MD, [place], [facility]. [facility]  To contact the attending provider between 7A-7P or the covering provider during after hours 7P-7A, please log into the web site [email or web address] and access using universal [place] password for that web site. If you do not have the password, please call the hospital operator.

## 2020-07-20 DIAGNOSIS — N179 Acute kidney failure, unspecified: Secondary | ICD-10-CM

## 2020-07-20 LAB — RESP PANEL BY RT-PCR (FLU A&B, COVID) ARPGX2
Influenza A by PCR: NEGATIVE
Influenza B by PCR: NEGATIVE
SARS Coronavirus 2 by RT PCR: NEGATIVE

## 2020-07-20 LAB — SARS CORONAVIRUS 2 (TAT 6-24 HRS): SARS Coronavirus 2: NEGATIVE

## 2020-07-20 MED ORDER — VITAMIN B-12 1000 MCG PO TABS: 1000.0000 ug | ORAL_TABLET | Freq: Every day | ORAL | Status: AC

## 2020-07-20 MED ORDER — ACETAMINOPHEN ER 650 MG PO TBCR: 650.0000 mg | EXTENDED_RELEASE_TABLET | Freq: Four times a day (QID) | ORAL | Status: AC | PRN

## 2020-07-20 MED ORDER — TRAMADOL HCL 50 MG PO TABS: 50.0000 mg | ORAL_TABLET | Freq: Two times a day (BID) | ORAL | 0 refills | Status: AC

## 2020-07-20 MED ORDER — LORAZEPAM 1 MG PO TABS: 1.0000 mg | ORAL_TABLET | Freq: Every day | ORAL | 0 refills | Status: AC

## 2020-07-20 NOTE — Discharge Instructions (Signed)

## 2020-07-20 NOTE — TOC Transition Note (Signed)
Transition of Care [facility]) - CM/SW Discharge Note   Patient Details  Name: Diane Nolan MRN: [id number] Date of Birth: December 08, 1928  [facility] CM/SW Contact:  Kermit Balo, RN Phone Number: 07/20/2020, 11:45 AM   Clinical Narrative:    Patient discharging to [facility]. [facility] providing transport.  Room: 106 Number for report: [phone]   Final next level of care: Skilled Nursing Facility Barriers to Discharge: No Barriers Identified   Patient Goals and CMS Choice   CMS Medicare.gov Compare Post Acute Care list provided to:: Patient Represenative (must comment) Choice offered to / list presented to : Adult Children  Discharge Placement              Patient chooses bed at: [facility] Patient to be transferred to facility by: [facility] Name of family member notified: Onalee Hua Patient and family notified of of transfer: 07/20/20  Discharge Plan and Services In-house Referral: Clinical Social Work Discharge Planning Services: [employer] Consult Post Acute Care Choice: Skilled Nursing Facility                               Social Determinants of Health (SDOH) Interventions     Readmission Risk Interventions No flowsheet data found.

## 2020-07-20 NOTE — Plan of Care (Signed)

## 2020-07-20 NOTE — Progress Notes (Signed)
Diane Nolan at [facility] given report. Awaiting [facility] to transfer.

## 2020-07-20 NOTE — Discharge Summary (Addendum)
Physician Discharge Summary  Diane Nolan UJW:[id number] DOB: 1928-10-25  PCP: Burton Apley, MD  Admitted from: Home Discharged to: SNF   Admit date: 07/13/2020 Discharge date: 07/20/2020  Recommendations for Outpatient Follow-up:    Follow-up Information     Tressie Stalker, MD. Schedule an appointment as soon as possible for a visit in 3 week(s).   Specialty: Neurosurgery Contact information: [address] [place] [address] [phone]         MD at SNF. Schedule an appointment as soon as possible for a visit.   Why: To be seen in 2 to 3 days with repeat labs (CBC & BMP).        Burton Apley, MD. Schedule an appointment as soon as possible for a visit.   Specialty: Internal Medicine Why: Upon discharge from SNF. Contact information: [address] [place] [address] [phone]                  Home Health: None    Equipment/Devices: TBD at [facility]    Discharge Condition: Improved and stable.   Code Status: Full Code Diet recommendation:  Discharge Diet Orders (From admission, onward)     Start     Ordered   07/20/20 0000  Diet - low sodium heart healthy        07/20/20 1103             Discharge Diagnoses:  Principal Problem:   Odontoid fracture with type II morphology and posterior displacement (HCC) Active Problems:   Renal insufficiency   Acute encephalopathy   Brief Summary: Diane Nolan is a 87 year old female with past medical history significant for essential hypertension, type 2 diabetes mellitus that appears controlled on diet alone, who presented to [facility] via EMS after being found down at home following an unwitnessed fall.  Patient was found by her son who was coming over to pick her up for a doctor's appointment; for apparently recurrent falls.   In the ED, her 98.4 F, HR 84, RR 16, BP 118/64, SPO2 100% on room air.  WBC 9.9, hemoglobin 12.2, platelets 217.  Sodium 136, potassium 3.8,  chloride 105, CO2 23, glucose 126, BUN 30, creatinine 1.69.  AST 19, ALT 8, total bilirubin 1.7.  COVID-19 PCR negative.  Influenza A/B PCR negative.  CT head with no acute intercranial abnormality.  CT C-spine with fracture through the base of the odontoid with minimal posterior displacement with respect to the body of C2, no other focal fracture noted.  Chest x-ray with no acute cardiopulmonary disease process, noted aortic atherosclerosis.  Left knee x-ray with severe tricompartmental degenerative arthritis, no acute fracture/dislocation.  Right knee x-ray with advanced degenerative changes, no acute bony abnormality.  Pelvis x-ray, no acute bony abnormality.  Neurosurgery consulted.  TRH consulted for further evaluation and management of acute C2 fracture with associated recurrent falls and acute renal failure.  Patient was transferred to [facility] for Neurosurgical evaluation.  Neurosurgery recommends no surgical intervention for type II odontoid fracture, should wear hard collar at all times, outpatient follow-up with Dr. Lovell Sheehan in 4 weeks.  At family's request, Neurology consulted on 6/29 for evaluation of confusion.      Assessment & Plan:     Odontoid fracture with type II morphology and posterior displacement Presented from home after being found down for unknown time by son. Patient with history of recurrent falls.  CT head unrevealing and CT C-spine with odontoid fracture with minimal posterior  displacement. --Neurosurgery evaluation/recommendations: Seen on 6/28.  Indicates that she has a type II odontoid fracture following a fall for which no surgical intervention is recommended, should wear hard cervical collar at all times and goal is for the fracture to heal with time, outpatient follow-up with Dr. Lovell SheehanJenkins in 4 weeks at which time a lateral x-ray of the C-spine will be performed.  No pain reported for several days now and has not required any as needed pain medications.  Remains  on prior home dose of tramadol. -Therapies recommended SNF.   Acute renal failure complicating underlying possible stage IIIb CKD. - Creatinine 1.69 at time of admission.  Baseline 0.89 from 01/04/2016 and no recent lab work since, even [facility].  Etiology likely secondary to prerenal azotemia from dehydration after being found down.  CK 192. -Post IV fluids, creatinine has gradually improved to 1.3, likely at her baseline.  AKI resolved. -Renal ultrasound shows mild left hydronephrosis.  Reviewed report and imaging with Urologist on-call today who recommended no further interventions at this time   Dehydration with hyponatremia: Resolved after IV fluids.  However at risk for recurrent dehydration if she has briefly inconsistent and poor oral intake.  Encourage p.o. fluids and diet.   Acute metabolic encephalopathy Likely due to acute illness, hospitalization, pain, pain meds, AKI, UTI which could be contributing to undiagnosed age-related cognitive impairment/dementia and B12 deficiency.  Delirium cautions.  Minimize sedatives and opioids.  Briefly needed Recruitment consultantsafety sitter.  TSH normal.  B12 low, completed parenteral B12 shots x5 days.  Completed treatment for UTI.  Resolved.  Per nursing, mild confusion during the/night hours, suspect sundowning.  Coherent again this morning.  As per son's request, neurology was consulted.  Underwent MRI without acute stroke, EEG without seizures, etiologies suspected as noted above and signed off.   E. coli acute cystitis: Urine culture shows >100 K colonies of E. coli, sensitive to ceftriaxone.  Completed 3 days course of IV ceftriaxone.  Urine microscopy on 6/30 showed microscopic hematuria which may be related to the acute cystitis.  Recommend repeating urine microscopy in 2 to 3 weeks and if the hematuria persists, then consider further evaluation which may include urology consultation.   B12 deficiency: Follow MMA results which may not be available  for several days (still pending at time of DC)-follow as outpatient.  Completed vitamin B12 IM daily x5 days.  Continue oral B12 supplements at discharge.  Recommend repeating B12 levels in 2 to 3 months.   Vaginal candidiasis Fluconazole 150 mg x 1 on 6/30.   Essential hypertension Controlled on Toprol-XL 25 Mg daily.   HLD:  Simvastatin 20 mg p.o. daily   Anxiety:  Patient chronically on lorazepam 2 mg at bedtime, here on 1 mg at bedtime.  Continue same and defer to outpatient follow-up regarding gradual taper to wean off.  Reviewed [place] PDMP and has been getting both Ativan and tramadol.   Advanced degenerative joint disease Severe tricompartmental disease noted on left knee with advanced degenerative disease noted on right.  On tramadol 50 mg p.o. twice daily at home. --Continue Tylenol, home tramadol, as needed Norco   Acute urinary retention: S/p in and out catheterization x2 on 6/28.  Resolved.   Scalp laceration: Required 7 staples by EDP on admission on 6/27.  These should be removed in 10 to 14 days from their placement, at SNF  Please note that both tramadol and Ativan are patient's chronic home meds and will need to be represcribed  at SNF.   Consultants:  Neurosurgery Neurology   Procedures:  In and out catheterization    Discharge Instructions  Discharge Instructions     Call MD for:   Complete by: As directed    Recurrent confusion or altered mental status.   Call MD for:  difficulty breathing, headache or visual disturbances   Complete by: As directed    Call MD for:  extreme fatigue   Complete by: As directed    Call MD for:  persistant dizziness or light-headedness   Complete by: As directed    Call MD for:  persistant nausea and vomiting   Complete by: As directed    Call MD for:  redness, tenderness, or signs of infection (pain, swelling, redness, odor or green/yellow discharge around incision site)   Complete by: As directed    Call MD for:   severe uncontrolled pain   Complete by: As directed    Call MD for:  temperature >100.4   Complete by: As directed    Diet - low sodium heart healthy   Complete by: As directed    Discharge instructions   Complete by: As directed    1) Patient had 7 staples placed for her scalp lacerations in the ED on 07/13/2020.  These must be removed in 7 to 10 days of placement. 2) Patient should wear hard cervical collar at all times until outpatient follow-up with Neurosurgery.   Increase activity slowly   Complete by: As directed    No wound care   Complete by: As directed         Medication List     TAKE these medications    acetaminophen 650 MG CR tablet Commonly known as: TYLENOL Take 1 tablet (650 mg total) by mouth every 6 (six) hours as needed for pain. What changed:  how much to take when to take this   LORazepam 1 MG tablet Commonly known as: ATIVAN Take 1 tablet (1 mg total) by mouth at bedtime. What changed:  medication strength how much to take   metoprolol succinate 25 MG 24 hr tablet Commonly known as: TOPROL-XL Take 25 mg by mouth daily.   OSTEO BI-FLEX ONE PER DAY PO Take 2 tablets by mouth daily.   simvastatin 20 MG tablet Commonly known as: ZOCOR Take 20 mg by mouth daily at 6 (six) AM.   traMADol 50 MG tablet Commonly known as: ULTRAM Take 1 tablet (50 mg total) by mouth 2 (two) times daily.   vitamin B-12 1000 MCG tablet Commonly known as: CYANOCOBALAMIN Take 1 tablet (1,000 mcg total) by mouth daily. Start taking on: July 21, 2020       No Known Allergies    Procedures/Studies: DG Chest 1 View  Result Date: 07/13/2020 CLINICAL DATA:  Pain following fall EXAM: CHEST  1 VIEW COMPARISON:  January 04, 2016 FINDINGS: Lungs are clear. Heart size and pulmonary vascularity are normal. There is aortic atherosclerosis. No adenopathy. Degenerative change noted in each shoulder. No pneumothorax. IMPRESSION: Lungs clear. Heart size normal. Aortic  Atherosclerosis (ICD10-I70.0). Electronically Signed   By: Bretta Bang III M.D.   On: 07/13/2020 16:46   DG Pelvis 1-2 Views  Result Date: 07/14/2020 CLINICAL DATA:  Leg weakness EXAM: PELVIS - 1-2 VIEW COMPARISON:  None. FINDINGS: Diffuse osteopenia. No acute bony abnormality. Specifically, no fracture, subluxation, or dislocation. IMPRESSION: No acute bony abnormality. Electronically Signed   By: Charlett Nose M.D.   On: 07/14/2020 00:04   DG Knee  1-2 Views Left  Result Date: 07/14/2020 CLINICAL DATA:  Fall, left knee pain EXAM: LEFT KNEE - 1-2 VIEW COMPARISON:  08/21/2017 FINDINGS: Two view radiograph of the left knee demonstrates normal alignment. No acute fracture or dislocation. Severe tricompartmental degenerative arthritis of the left knee, progressive since prior examination. Degenerative chondrocalcinosis noted involving the medial and lateral menisci. Probable calcific suprapatellar bursitis present. No effusion. The soft tissues are otherwise unremarkable. IMPRESSION: Severe tricompartmental degenerative arthritis. No acute fracture or dislocation. Electronically Signed   By: Helyn Numbers MD   On: 07/14/2020 00:05   DG Knee 1-2 Views Right  Result Date: 07/14/2020 CLINICAL DATA: Leg weakness EXAM: RIGHT KNEE - 1-2 VIEW COMPARISON:  None. FINDINGS: Advanced tricompartment degenerative changes with joint space narrowing and spurring. No joint effusion. IMPRESSION: Advanced degenerative changes.  No acute bony abnormality. Electronically Signed   By: Charlett Nose M.D.   On: 07/14/2020 00:03   CT Head Wo Contrast  Result Date: 07/13/2020 CLINICAL DATA:  Recent fall with headaches and neck pain, initial encounter EXAM: CT HEAD WITHOUT CONTRAST CT CERVICAL SPINE WITHOUT CONTRAST TECHNIQUE: Multidetector CT imaging of the head and cervical spine was performed following the standard protocol without intravenous contrast. Multiplanar CT image reconstructions of the cervical spine were also  generated. COMPARISON:  None. FINDINGS: CT HEAD FINDINGS Brain: No evidence of acute infarction, hemorrhage, hydrocephalus, extra-axial collection or mass lesion/mass effect. Mild atrophic changes are noted. Vascular: No hyperdense vessel or unexpected calcification. Skull: Normal. Negative for fracture or focal lesion. Sinuses/Orbits: No acute finding. Other: None. CT CERVICAL SPINE FINDINGS Alignment: Within normal limits with the exception of the odontoid Skull base and vertebrae: There is a fracture at the base of the odontoid with the proximally 1.5 mm of posterior displacement of the odontoid with respect to the body of C2. No significant central canal stenosis is noted. Multilevel facet hypertrophic changes are noted. Disc space narrowing is noted from C3 to C6 with associated osteophytic changes. No other fractures are seen. Soft tissues and spinal canal: Surrounding soft tissue structures are within normal limits. Upper chest: Visualized lung apices are within normal limits. Other: None IMPRESSION: CT of the head: No acute intracranial abnormality noted. CT of the cervical spine: Fracture through the base of the odontoid with only minimal posterior displacement with respect to the body of C2. No other focal fracture is seen. Multilevel degenerative change. Critical Value/emergent results were called by telephone at the time of interpretation on 07/13/2020 at 4:50 pm to Dr. Tilden Fossa , who verbally acknowledged these results. Electronically Signed   By: Alcide Clever M.D.   On: 07/13/2020 16:51   CT Cervical Spine Wo Contrast  Result Date: 07/13/2020 CLINICAL DATA:  Recent fall with headaches and neck pain, initial encounter EXAM: CT HEAD WITHOUT CONTRAST CT CERVICAL SPINE WITHOUT CONTRAST TECHNIQUE: Multidetector CT imaging of the head and cervical spine was performed following the standard protocol without intravenous contrast. Multiplanar CT image reconstructions of the cervical spine were also  generated. COMPARISON:  None. FINDINGS: CT HEAD FINDINGS Brain: No evidence of acute infarction, hemorrhage, hydrocephalus, extra-axial collection or mass lesion/mass effect. Mild atrophic changes are noted. Vascular: No hyperdense vessel or unexpected calcification. Skull: Normal. Negative for fracture or focal lesion. Sinuses/Orbits: No acute finding. Other: None. CT CERVICAL SPINE FINDINGS Alignment: Within normal limits with the exception of the odontoid Skull base and vertebrae: There is a fracture at the base of the odontoid with the proximally 1.5 mm of posterior displacement  of the odontoid with respect to the body of C2. No significant central canal stenosis is noted. Multilevel facet hypertrophic changes are noted. Disc space narrowing is noted from C3 to C6 with associated osteophytic changes. No other fractures are seen. Soft tissues and spinal canal: Surrounding soft tissue structures are within normal limits. Upper chest: Visualized lung apices are within normal limits. Other: None IMPRESSION: CT of the head: No acute intracranial abnormality noted. CT of the cervical spine: Fracture through the base of the odontoid with only minimal posterior displacement with respect to the body of C2. No other focal fracture is seen. Multilevel degenerative change. Critical Value/emergent results were called by telephone at the time of interpretation on 07/13/2020 at 4:50 pm to Dr. Tilden Fossa , who verbally acknowledged these results. Electronically Signed   By: Alcide Clever M.D.   On: 07/13/2020 16:51   MR BRAIN WO CONTRAST  Result Date: 07/15/2020 CLINICAL DATA:  Found down.  Confusion. EXAM: MRI HEAD WITHOUT CONTRAST TECHNIQUE: Multiplanar, multiecho pulse sequences of the brain and surrounding structures were obtained without intravenous contrast. COMPARISON:  None. FINDINGS: Brain: No acute infarct, mass effect or extra-axial collection. No acute or chronic hemorrhage. Normal white matter signal,  parenchymal volume and CSF spaces. The midline structures are normal. Vascular: Major flow voids are preserved. Skull and upper cervical spine: Normal calvarium and skull base. Visualized upper cervical spine and soft tissues are normal. Sinuses/Orbits:No paranasal sinus fluid levels or advanced mucosal thickening. No mastoid or middle ear effusion. Normal orbits. IMPRESSION: Normal aging brain. Electronically Signed   By: Deatra Robinson M.D.   On: 07/15/2020 23:32   US RENAL  Result Date: 07/19/2020 CLINICAL DATA:  Acute kidney injury EXAM: RENAL / URINARY TRACT ULTRASOUND COMPLETE COMPARISON:  None. FINDINGS: Right Kidney: Renal measurements: 9.6 x 3.4 x 3.7 cm = volume: 63 mL. There are 2 renal cysts. The larger measures 14 mm. No hydronephrosis. Left Kidney: Renal measurements: 7.7 x 4.1 x 3.9 cm = volume: 66 mL. Mild hydronephrosis. Multiple renal cysts. The larger measures 14 mm. Bladder: Appears normal for degree of bladder distention. Other: None. IMPRESSION: Mild left hydronephrosis. Electronically Signed   By: Deatra Robinson M.D.   On: 07/19/2020 01:03   EEG adult  Result Date: 07/16/2020 Charlsie Quest, MD     07/16/2020 12:04 PM Patient Name: Pa Tennant MRN: [id number] Epilepsy Attending: Charlsie Quest Referring Physician/Provider: Jimmye Norman, NP Date: 07/16/2020 Duration: 23.30 mins Patient history: 87 year old female with unwitnessed fall and confusion.  EEG to evaluate for seizures. Level of alertness: Awake AEDs during EEG study: Ativan Technical aspects: This EEG study was done with scalp electrodes positioned according to the 10-20 International system of electrode placement. Electrical activity was acquired at a sampling rate of  and reviewed with a high frequency filter of  and a low frequency filter of . EEG data were recorded continuously and digitally stored. Description: The posterior dominant rhythm consists of  activity of moderate voltage (25-35 uV) seen  predominantly in posterior head regions, symmetric and reactive to eye opening and eye closing. Hyperventilation and photic stimulation were not performed.   IMPRESSION: This study is within normal limits. No seizures or epileptiform discharges were seen throughout the recording. Priyanka Annabelle Harman      Subjective: Denies complaints.  No pain reported.  Quite coherent.  She says that even though it is a July 4 holiday, her son may have to going to work for a couple of hours  being the Public librarian.  Discharge Exam:  Vitals:   07/19/20 2339 07/20/20 0424 07/20/20 0730 07/20/20 1009  BP: 126/67 136/62 139/76 123/71  Pulse: 94 81 92 95  Resp: 18 18 18    Temp: 98.2 F (36.8 C) 98.9 F (37.2 C) 98.8 F (37.1 C)   TempSrc: Oral  Oral   SpO2: 96% 97% 97%   Weight:      Height:        General exam: Pleasant elderly female, moderately built and frail, sitting up comfortably in bed. Neck: Has hard cervical collar in place. Respiratory system: Clear to auscultation.  No increased work of breathing. Cardiovascular system: S1 and S2 heard, RRR.  No JVD, murmurs or pedal edema.  Short systolic murmur 2/6 best heard at apex. Gastrointestinal system: Abdomen is nondistended, soft and nontender. No organomegaly or masses felt. Normal bowel sounds heard. Central nervous system: Alert and oriented.  No focal deficits.. Extremities: Symmetric 5 x 5 power. Skin: No rashes, lesions or ulcers Psychiatry: Judgement and insight improved intact.  Mood & affect appropriate.      The results of significant diagnostics from this hospitalization (including imaging, microbiology, ancillary and laboratory) are listed below for reference.     Microbiology: Recent Results (from the past 240 hour(s))  Resp Panel by RT-PCR (Flu A&B, Covid) Nasopharyngeal Swab     Status: None   Collection Time: 07/13/20  5:46 PM   Specimen: Nasopharyngeal Swab; Nasopharyngeal(NP) swabs in vial transport medium  Result Value  Ref Range Status   SARS Coronavirus 2 by RT PCR NEGATIVE NEGATIVE Final    Comment: (NOTE) SARS-CoV-2 target nucleic acids are NOT DETECTED.  The SARS-CoV-2 RNA is generally detectable in upper respiratory specimens during the acute phase of infection. The lowest concentration of SARS-CoV-2 viral copies this assay can detect is 138 copies/mL. A negative result does not preclude SARS-Cov-2 infection and should not be used as the sole basis for treatment or other patient management decisions. A negative result may occur with  improper specimen collection/handling, submission of specimen other than nasopharyngeal swab, presence of viral mutation(s) within the areas targeted by this assay, and inadequate number of viral copies(<138 copies/mL). A negative result must be combined with clinical observations, patient history, and epidemiological information. The expected result is Negative.  Fact Sheet for Patients:  07/15/20  Fact Sheet for Healthcare Providers:  BloggerCourse.com  This test is no t yet approved or cleared by the [place] FDA and  has been authorized for detection and/or diagnosis of SARS-CoV-2 by FDA under an Emergency Use Authorization (EUA). This EUA will remain  in effect (meaning this test can be used) for the duration of the COVID-19 declaration under Section 564(b)(1) of the Act, 21 U.S.C.section 360bbb-3(b)(1), unless the authorization is terminated  or revoked sooner.       Influenza A by PCR NEGATIVE NEGATIVE Final   Influenza B by PCR NEGATIVE NEGATIVE Final    Comment: (NOTE) The Xpert Xpress SARS-CoV-2/FLU/RSV plus assay is intended as an aid in the diagnosis of influenza from Nasopharyngeal swab specimens and should not be used as a sole basis for treatment. Nasal washings and aspirates are unacceptable for Xpert Xpress SARS-CoV-2/FLU/RSV testing.  Fact Sheet for  Patients: [place]  Fact Sheet for Healthcare Providers: BloggerCourse.com  This test is not yet approved or cleared by the [place] FDA and has been authorized for detection and/or diagnosis of SARS-CoV-2 by FDA under an Emergency Use Authorization (EUA). This EUA will remain  in effect (meaning this test can be used) for the duration of the COVID-19 declaration under Section 564(b)(1) of the Act, 21 U.S.C. section 360bbb-3(b)(1), unless the authorization is terminated or revoked.  Performed at [facility], [address], [place], [place] [address]   Culture, Urine     Status: Abnormal   Collection Time: 07/16/20  2:08 PM   Specimen: Urine, Random  Result Value Ref Range Status   Specimen Description URINE, RANDOM  Final   Special Requests   Final    NONE Performed at [facility], [address] [place], [place] [address]    Culture >=100,000 COLONIES/mL ESCHERICHIA COLI (A)  Final   Report Status 07/18/2020 FINAL  Final   Organism ID, Bacteria ESCHERICHIA COLI (A)  Final      Susceptibility   Escherichia coli - MIC*    AMPICILLIN RESISTANT Resistant     CEFAZOLIN <=4 SENSITIVE Sensitive     CEFEPIME <=0.12 SENSITIVE Sensitive     CEFTRIAXONE 0.5 SENSITIVE Sensitive     CIPROFLOXACIN <=0.25 SENSITIVE Sensitive     GENTAMICIN <=1 SENSITIVE Sensitive     IMIPENEM <=0.25 SENSITIVE Sensitive     NITROFURANTOIN <=16 SENSITIVE Sensitive     TRIMETH/SULFA <=20 SENSITIVE Sensitive     AMPICILLIN/SULBACTAM 4 SENSITIVE Sensitive     PIP/TAZO <=4 SENSITIVE Sensitive     * >=100,000 COLONIES/mL ESCHERICHIA COLI  SARS CORONAVIRUS 2 (TAT 6-24 HRS) Nasopharyngeal Nasopharyngeal Swab     Status: None   Collection Time: 07/17/20 10:42 AM   Specimen: Nasopharyngeal Swab  Result Value Ref Range Status   SARS Coronavirus 2 NEGATIVE NEGATIVE Final    Comment: (NOTE) SARS-CoV-2 target nucleic acids  are NOT DETECTED.  The SARS-CoV-2 RNA is generally detectable in upper and lower respiratory specimens during the acute phase of infection. Negative results do not preclude SARS-CoV-2 infection, do not rule out co-infections with other pathogens, and should not be used as the sole basis for treatment or other patient management decisions. Negative results must be combined with clinical observations, patient history, and epidemiological information. The expected result is Negative.  Fact Sheet for Patients: HairSlick.no  Fact Sheet for Healthcare Providers: quierodirigir.com  This test is not yet approved or cleared by the [place] FDA and  has been authorized for detection and/or diagnosis of SARS-CoV-2 by FDA under an Emergency Use Authorization (EUA). This EUA will remain  in effect (meaning this test can be used) for the duration of the COVID-19 declaration under Se ction 564(b)(1) of the Act, 21 U.S.C. section 360bbb-3(b)(1), unless the authorization is terminated or revoked sooner.  Performed at [facility], [address], [place], [place] [address]      Labs: CBC: Recent Labs  Lab 07/13/20 1607  WBC 9.9  NEUTROABS 8.4*  HGB 12.2  HCT 37.8  MCV 96.2  PLT 217    Basic Metabolic Panel: Recent Labs  Lab 07/13/20 1636 07/15/20 0250 07/16/20 0432 07/17/20 0245 07/18/20 0359  NA 136 133* 129* 134* 133*  K 3.8 3.7 3.9 3.6 3.6  CL 105 102 96* 103 100  CO2 23 24 25 24 22   GLUCOSE 126* 125* 92 98 74  BUN 30* 25* 21 21 19   CREATININE 1.69* 1.46* 1.45* 1.36* 1.32*  CALCIUM 8.6* 8.6* 8.5* 8.1* 8.6*    Liver Function Tests: Recent Labs  Lab 07/13/20 1636 07/18/20 0359  AST 19 12*  ALT 8 10  ALKPHOS 110 94  BILITOT  1.7* 1.1  PROT 6.6 5.3*  ALBUMIN 3.4* 2.5*    CBG: Recent Labs  Lab 07/13/20 2231  GLUCAP 112*    Urinalysis    Component Value Date/Time   COLORURINE YELLOW  07/16/2020 0921   APPEARANCEUR HAZY (A) 07/16/2020 0921   LABSPEC 1.014 07/16/2020 0921   PHURINE 6.0 07/16/2020 0921   GLUCOSEU NEGATIVE 07/16/2020 0921   HGBUR MODERATE (A) 07/16/2020 0921   BILIRUBINUR NEGATIVE 07/16/2020 0921   KETONESUR 5 (A) 07/16/2020 0921   PROTEINUR NEGATIVE 07/16/2020 0921   UROBILINOGEN 0.2 04/29/2011 1132   NITRITE POSITIVE (A) 07/16/2020 0921   LEUKOCYTESUR SMALL (A) 07/16/2020 0921      Time coordinating discharge: 35 minutes  SIGNED:  Marcellus Scott, MD, FACP, [facility]. [facility]  To contact the attending provider between 7A-7P or the covering provider during after hours 7P-7A, please log into the web site [email or web address] and access using universal [place] password for that web site. If you do not have the password, please call the hospital operator.

## 2020-07-21 LAB — METHYLMALONIC ACID, SERUM: Methylmalonic Acid, Quantitative: 515 nmol/L — ABNORMAL HIGH (ref 0–378)

## 2021-01-17 ENCOUNTER — Encounter (HOSPITAL_COMMUNITY): Payer: Self-pay | Admitting: Emergency Medicine

## 2021-01-17 ENCOUNTER — Emergency Department (HOSPITAL_COMMUNITY)
Admission: EM | Admit: 2021-01-17 | Discharge: 2021-01-17 | Disposition: A | Discharge status: Home / Self Care | Payer: Medicare Other | Attending: Emergency Medicine | Admitting: Emergency Medicine

## 2021-01-17 DIAGNOSIS — E119 Type 2 diabetes mellitus without complications: Secondary | ICD-10-CM | POA: Diagnosis not present

## 2021-01-17 DIAGNOSIS — I10 Essential (primary) hypertension: Secondary | ICD-10-CM | POA: Insufficient documentation

## 2021-01-17 DIAGNOSIS — R040 Epistaxis: Secondary | ICD-10-CM | POA: Diagnosis not present

## 2021-01-17 NOTE — ED Triage Notes (Addendum)
Pt bib [facility] from [facility] facility for persistent nosebleed that first began at least 1 hour ago. Pt is not on any blood thinner medications. 1 aphrin spray administered in each nostril by PTAR without improvement. Left nostril still streaming blood at this time. Pt arrives in C-collar from previous injury, unrelated to this ED admission.   PTAR vitals: 90/50 RR 18 95% on room air

## 2021-01-17 NOTE — ED Notes (Signed)
PTAR CALLED  °

## 2021-01-17 NOTE — ED Provider Notes (Signed)
[facility]-EMERGENCY DEPT [facility] Emergency Department Provider Note MRN:  [id number]  Arrival date & time: 01/17/21     Chief Complaint   Nosebleed History of Present Illness   Diane Nolan is a 88 y.o. year-old female with a history of hypertension presenting to the ED with chief complaint of nosebleed.  Nosebleed that began 1 hour ago, arriving via EMS.  Denies blood thinners.  Denies trauma to the nose.  Denies any recent cough or cold-like symptoms or nasal congestion.  EMS administered some Afrin in route.  Review of Systems  A thorough review of systems was obtained and all systems are negative except as noted in the HPI and PMH.   Patient's Health History    Past Medical History:  Diagnosis Date   Diabetes mellitus    HLD (hyperlipidemia)    Hypertension    Intracervical pessary    in place-dr henley manages    Past Surgical History:  Procedure Laterality Date   CHILD BIRTH  X1   CHOLECYSTECTOMY  06/01/2011   Procedure: LAPAROSCOPIC CHOLECYSTECTOMY WITH INTRAOPERATIVE CHOLANGIOGRAM;  Surgeon: Clovis Pu. Cornett, MD;  Location: [facility];  Service: General;  Laterality: N/A;  Laparoscopic cholecystectomy with cholangiogram    Family History  Problem Relation Age of Onset   Other Mother        Old Age   Diabetes Father     Social History   Socioeconomic History   Marital status: Widowed    Spouse name: Not on file   Number of children: 1   Years of education: Not on file   Highest education level: Not on file  Occupational History   Not on file  Tobacco Use   Smoking status: Never   Smokeless tobacco: Never  Substance and Sexual Activity   Alcohol use: No   Drug use: No   Sexual activity: Not Currently  Other Topics Concern   Not on file  Social History Narrative   Not on file   Social Determinants of Health   Financial Resource Strain: Not on file  Food Insecurity: Not on file  Transportation Needs: Not on file  Physical  Activity: Not on file  Stress: Not on file  Social Connections: Not on file  Intimate Partner Violence: Not on file     Physical Exam   Vitals:   01/17/21 0404 01/17/21 0445  BP:  110/62  Pulse:  88  Resp:  18  Temp: 98.5 F (36.9 C)   SpO2:  99%    CONSTITUTIONAL: Well-appearing, NAD NEURO:  Alert and oriented x 3, no focal deficits EYES:  eyes equal and reactive ENT/NECK:  no LAD, no JVD; minimal bleeding from the right nare CARDIO: Regular rate, well-perfused, normal S1 and S2 PULM:  CTAB no wheezing or rhonchi GI/GU:  non-distended, non-tender MSK/SPINE:  No gross deformities, no edema SKIN:  no rash, atraumatic   *Additional and/or pertinent findings included in MDM below  Diagnostic and Interventional Summary    EKG Interpretation  Date/Time:    Ventricular Rate:    PR Interval:    QRS Duration:   QT Interval:    QTC Calculation:   R Axis:     Text Interpretation:         Labs Reviewed - No data to display  No orders to display    Medications - No data to display   Procedures  /  Critical Care .Epistaxis Management  Date/Time: 01/17/2021 3:59 AM Performed by: Sabas Sous,  MD Authorized by: Sabas Sous, MD   Consent:    Consent obtained:  Verbal   Consent given by:  Patient   Risks, benefits, and alternatives were discussed: yes     Risks discussed:  Bleeding, infection and pain Universal protocol:    Patient identity confirmed:  Verbally with patient Anesthesia:    Anesthesia method:  None Procedure details:    Treatment site: Bilateral naris.   Repair method: Firm pressure.   Treatment complexity:  Limited Post-procedure details:    Assessment:  Bleeding stopped   Procedure completion:  Tolerated well, no immediate complications  ED Course and Medical Decision Making  I have reviewed the triage vital signs, the nursing notes, and pertinent available records from the EMR.  Listed above are laboratory and imaging tests that I  personally ordered, reviewed, and interpreted and then considered in my medical decision making (see below for details).  ED Course Presenting with spontaneous nosebleed, the bleeding is fairly minimal on my initial assessment.  Patient was asked to blow her nose and then we administered more Afrin to bilateral nares, and now we will apply constant pressure for 20 minutes and reassess.   Further history obtained from son, patient has been having intermittent nosebleeds for a week.  Patient's bleeding is resolved, nares closely examined without any sites that are worthy of chemical cauterization.  Patient observed for another hour with no return of bleeding, appropriate for discharge.  Complexity of Problems Addressed Chronic Illness with Exacerbation  Additional Data Reviewed and Analyzed Further history obtained from: EMS on arrival and External historian: Son  Elmer Sow. Pilar Plate, MD [facility] Emergency Medicine [facility] [email or web address]  Final Clinical Impressions(s) / ED Diagnoses     ICD-10-CM   1. Epistaxis  R04.0       ED Discharge Orders     None        Discharge Instructions Discussed with and Provided to Patient:     Discharge Instructions      You were evaluated in the Emergency Department and after careful evaluation, we did not find any emergent condition requiring admission or further testing in the hospital.  Your exam/testing today was overall reassuring.  We were able to stop your nose from bleeding any further here in the emergency department.  Recommend application of Vaseline gel inside the nostrils daily.  Recommend follow-up with the ear nose and throat doctors if the bleeding continues.  Please return to the Emergency Department if you experience any worsening of your condition.  Thank you for allowing [place] to be a part of your care.         Sabas Sous, MD 01/17/21 [phone]

## 2021-01-17 NOTE — ED Notes (Signed)
E-signature pad unavailable at time of pt discharge. This RN discussed discharge materials with pt and answered all pt questions. Pt stated understanding of discharge material. ? ?

## 2021-01-17 NOTE — ED Notes (Addendum)
ED provider administered nasal decongestant spray into each nare.

## 2021-01-17 NOTE — Discharge Instructions (Signed)
You were evaluated in the Emergency Department and after careful evaluation, we did not find any emergent condition requiring admission or further testing in the hospital.  Your exam/testing today was overall reassuring.  We were able to stop your nose from bleeding any further here in the emergency department.  Recommend application of Vaseline gel inside the nostrils daily.  Recommend follow-up with the ear nose and throat doctors if the bleeding continues.  Please return to the Emergency Department if you experience any worsening of your condition.  Thank you for allowing [place] to be a part of your care.

## 2021-04-11 ENCOUNTER — Emergency Department (HOSPITAL_COMMUNITY)
Admission: EM | Admit: 2021-04-11 | Discharge: 2021-04-11 | Disposition: A | Discharge status: Skilled Nursing Facility | Payer: Medicare Other | Attending: Emergency Medicine | Admitting: Emergency Medicine

## 2021-04-11 ENCOUNTER — Emergency Department (HOSPITAL_COMMUNITY): Payer: Medicare Other

## 2021-04-11 DIAGNOSIS — R040 Epistaxis: Secondary | ICD-10-CM | POA: Diagnosis present

## 2021-04-11 DIAGNOSIS — N390 Urinary tract infection, site not specified: Secondary | ICD-10-CM | POA: Insufficient documentation

## 2021-04-11 LAB — CBC WITH DIFFERENTIAL/PLATELET
Abs Immature Granulocytes: 0.02 10*3/uL (ref 0.00–0.07)
Basophils Absolute: 0 10*3/uL (ref 0.0–0.1)
Basophils Relative: 0 %
Eosinophils Absolute: 0 10*3/uL (ref 0.0–0.5)
Eosinophils Relative: 0 %
HCT: 33.9 % — ABNORMAL LOW (ref 36.0–46.0)
Hemoglobin: 10.8 g/dL — ABNORMAL LOW (ref 12.0–15.0)
Immature Granulocytes: 0 %
Lymphocytes Relative: 21 %
Lymphs Abs: 1.6 10*3/uL (ref 0.7–4.0)
MCH: 27.8 pg (ref 26.0–34.0)
MCHC: 31.9 g/dL (ref 30.0–36.0)
MCV: 87.4 fL (ref 80.0–100.0)
Monocytes Absolute: 0.3 10*3/uL (ref 0.1–1.0)
Monocytes Relative: 4 %
Neutro Abs: 5.7 10*3/uL (ref 1.7–7.7)
Neutrophils Relative %: 75 %
Platelets: 313 10*3/uL (ref 150–400)
RBC: 3.88 MIL/uL (ref 3.87–5.11)
RDW: 14.7 % (ref 11.5–15.5)
WBC: 7.6 10*3/uL (ref 4.0–10.5)
nRBC: 0 % (ref 0.0–0.2)

## 2021-04-11 LAB — BASIC METABOLIC PANEL
Anion gap: 8 (ref 5–15)
BUN: 38 mg/dL — ABNORMAL HIGH (ref 8–23)
CO2: 25 mmol/L (ref 22–32)
Calcium: 8.9 mg/dL (ref 8.9–10.3)
Chloride: 100 mmol/L (ref 98–111)
Creatinine, Ser: 1.57 mg/dL — ABNORMAL HIGH (ref 0.44–1.00)
GFR, Estimated: 31 mL/min — ABNORMAL LOW (ref >60)
Glucose, Bld: 114 mg/dL — ABNORMAL HIGH (ref 70–99)
Potassium: 4.8 mmol/L (ref 3.5–5.1)
Sodium: 133 mmol/L — ABNORMAL LOW (ref 135–145)

## 2021-04-11 LAB — URINALYSIS, ROUTINE W REFLEX MICROSCOPIC
Bilirubin Urine: NEGATIVE
Glucose, UA: NEGATIVE mg/dL
Ketones, ur: NEGATIVE mg/dL
Nitrite: NEGATIVE
Protein, ur: 100 mg/dL — AB
Specific Gravity, Urine: 1.02 (ref 1.005–1.030)
pH: 6.5 (ref 5.0–8.0)

## 2021-04-11 LAB — URINALYSIS, MICROSCOPIC (REFLEX)
RBC / HPF: >50 RBC/hpf (ref 0–5)
WBC, UA: >50 WBC/hpf (ref 0–5)

## 2021-04-11 MED ORDER — AMOXICILLIN-POT CLAVULANATE 875-125 MG PO TABS: 1.0000 | ORAL_TABLET | Freq: Two times a day (BID) | ORAL | 0 refills | Status: AC

## 2021-04-11 NOTE — Discharge Instructions (Addendum)
Follow-up with the urologist within 7 to 10 days if you continue to have bleeding or discharge from your urine.  Otherwise finish the antibiotics as written. ? ?Return immediately back to the ER if: ? ?Your symptoms worsen within the next 12-24 hours. ?You develop new symptoms such as new fevers, persistent vomiting, new pain, shortness of breath, or new weakness or numbness, or if you have any other concerns. ? ?

## 2021-04-11 NOTE — ED Notes (Addendum)
[facility] called for transport. They report she is about 5th on their list to be transported. ?

## 2021-04-11 NOTE — ED Notes (Signed)
Report given to a nurse, Landry Dyke, at [facility].  ?

## 2021-04-11 NOTE — ED Provider Notes (Signed)
?[facility]-EMERGENCY DEPT ?Provider Note ? ? ?CSN: [id number] ?Arrival date & time: 04/11/21  1243 ? ?  ? ?History ? ?Chief Complaint  ?Patient presents with  ? Hemoptysis  ? Epistaxis  ? ? ?Diane Nolan is a 88 y.o. female. ? ?Patient presents from a nursing home chief complaint of recent nosebleed and coughing up blood immediately afterwards.  Nosebleeding is stopped, patient has no complaints of headache or chest pain or abdominal pain.  She states she was recently diagnosed with urinary tract infection is noted some bleeding from her urine still.  She was started on Cipro yesterday and today she is on day 2.  Otherwise no reports of fevers or cough or vomiting or diarrhea.  She is accompanied by her son who also provides history. ? ? ?  ? ?Home Medications ?Prior to Admission medications   ?Medication Sig Start Date End Date Taking? Authorizing Provider  ?acetaminophen (TYLENOL) 650 MG CR tablet Take 1 tablet (650 mg total) by mouth every 6 (six) hours as needed for pain. 07/20/20  Yes Hongalgi, Maximino GreenlandAnand D, MD  ?Alum & Mag Hydroxide-Simeth (MYLANTA PO) Take 30 mLs by mouth every 2 (two) hours as needed (indigestion).   Yes [provider]  ?Boswellia-Glucosamine-Vit D (OSTEO BI-FLEX ONE PER DAY PO) Take 2 tablets by mouth every morning.   Yes [provider]  ?Cholecalciferol (VITAMIN D3) 1.25 MG (50000 UT) CAPS Take 50,000 Units by mouth every Friday.   Yes [provider]  ?docusate sodium (COLACE) 100 MG capsule Take 100 mg by mouth 2 (two) times daily.   Yes [provider]  ?ferrous sulfate (FEROSUL) 325 (65 FE) MG tablet Take 325 mg by mouth 2 (two) times daily.   Yes [provider]  ?levothyroxine (SYNTHROID) 25 MCG tablet Take 25 mcg by mouth daily at 6 (six) AM.   Yes [provider]  ?LORazepam (ATIVAN) 1 MG tablet Take 1 tablet (1 mg total) by mouth at bedtime. 07/20/20  Yes Hongalgi, Maximino GreenlandAnand D, MD  ?metoprolol succinate (TOPROL-XL) 25  MG 24 hr tablet Take 25 mg by mouth every morning. 11/20/15  Yes [provider]  ?mirtazapine (REMERON) 15 MG tablet Take 15 mg by mouth at bedtime.   Yes [provider]  ?Nutritional Supplements (NEPRO PO) Take 1 each by mouth 3 (three) times daily.   Yes [provider]  ?Nutritional Supplements (NUTRITIONAL SUPPLEMENT PO) Take 1 each by mouth See admin instructions. Take one magic cup or might shake as available by mouth daily with lunch and supper due to weight loss   Yes [provider]  ?oxymetazoline (AFRIN) 0.05 % nasal spray Place 2 sprays into both nostrils every 12 (twelve) hours as needed (nose bleed). Do not use for more than 3 consecutive days   Yes [provider]  ?polyethylene glycol (MIRALAX / GLYCOLAX) 17 g packet Take 17 g by mouth every morning. Mix in 4-8 oz water and drink   Yes [provider]  ?sertraline (ZOLOFT) 25 MG tablet Take 25 mg by mouth at bedtime.   Yes [provider]  ?simvastatin (ZOCOR) 20 MG tablet Take 20 mg by mouth daily at 6 (six) AM.   Yes [provider]  ?sodium chloride (OCEAN) 0.65 % SOLN nasal spray Place 1 spray into both nostrils at bedtime.   Yes [provider]  ?traMADol (ULTRAM) 50 MG tablet Take 1 tablet (50 mg total) by mouth 2 (two) times daily. 07/20/20  Yes  Elease Etienne, MD  ?vitamin B-12 (CYANOCOBALAMIN) 1000 MCG tablet Take 1 tablet (1,000 mcg total) by mouth daily. ?Patient taking differently: Take 1,000 mcg by mouth every morning. 07/21/20  Yes Hongalgi, Maximino Greenland, MD  ?vitamin C (ASCORBIC ACID) 500 MG tablet Take 500 mg by mouth 2 (two) times daily.   Yes [provider]  ?ciprofloxacin (CIPRO) 500 MG tablet Take 500 mg by mouth 2 (two) times daily. ?Patient not taking: Reported on 04/11/2021    [provider]  ?   ? ?Allergies    ?Patient has no known allergies.   ? ?Review of Systems   ?Review of Systems  ?Constitutional:  Negative for fever.  ?HENT:   Negative for ear pain.   ?Eyes:  Negative for pain.  ?Respiratory:  Positive for cough.   ?Cardiovascular:  Negative for chest pain.  ?Gastrointestinal:  Negative for abdominal pain.  ?Genitourinary:  Negative for flank pain.  ?Musculoskeletal:  Negative for back pain.  ?Skin:  Negative for rash.  ?Neurological:  Negative for headaches.  ? ?Physical Exam ?Updated Vital Signs ?BP 131/67   Pulse 88   Temp 97.9 ?F (36.6 ?C) (Oral)   Resp 16   SpO2 99%  ?Physical Exam ?Constitutional:   ?   General: She is not in acute distress. ?   Appearance: Normal appearance.  ?HENT:  ?   Head: Normocephalic.  ?   Nose: Nose normal.  ?   Comments: Bilateral nares are clear, no active bleeding noted. ?Eyes:  ?   Extraocular Movements: Extraocular movements intact.  ?Cardiovascular:  ?   Rate and Rhythm: Normal rate.  ?Pulmonary:  ?   Effort: Pulmonary effort is normal.  ?Abdominal:  ?   Tenderness: There is no abdominal tenderness. There is no guarding or rebound.  ?Musculoskeletal:     ?   General: Normal range of motion.  ?   Cervical back: Normal range of motion.  ?Neurological:  ?   General: No focal deficit present.  ?   Mental Status: She is alert. Mental status is at baseline.  ? ? ?ED Results / Procedures / Treatments   ?Labs ?(all labs ordered are listed, but only abnormal results are displayed) ?Labs Reviewed  ?CBC WITH DIFFERENTIAL/PLATELET - Abnormal; Notable for the following components:  ?    Result Value  ? Hemoglobin 10.8 (*)   ? HCT 33.9 (*)   ? All other components within normal limits  ?BASIC METABOLIC PANEL - Abnormal; Notable for the following components:  ? Sodium 133 (*)   ? Glucose, Bld 114 (*)   ? BUN 38 (*)   ? Creatinine, Ser 1.57 (*)   ? GFR, Estimated 31 (*)   ? All other components within normal limits  ?URINALYSIS, ROUTINE W REFLEX MICROSCOPIC - Abnormal; Notable for the following components:  ? APPearance CLOUDY (*)   ? Hgb urine dipstick LARGE (*)   ? Protein, ur 100 (*)   ? Leukocytes,Ua LARGE  (*)   ? All other components within normal limits  ?URINALYSIS, MICROSCOPIC (REFLEX) - Abnormal; Notable for the following components:  ? Bacteria, UA FEW (*)   ? All other components within normal limits  ? ? ?EKG ?None ? ?Radiology ?DG Chest Port 1 View ? ?Result Date: 04/11/2021 ?CLINICAL DATA:  Hemoptysis EXAM: PORTABLE CHEST 1 VIEW COMPARISON:  07/13/2020 FINDINGS: The heart size and mediastinal contours are within normal limits. Aortic atherosclerosis. No focal airspace consolidation, pleural effusion, or pneumothorax. IMPRESSION: No active  disease. Electronically Signed   By: Duanne Guess D.O.   On: 04/11/2021 14:11   ? ?Procedures ?Procedures  ? ? ?Medications Ordered in ED ?Medications - No data to display ? ?ED Course/ Medical Decision Making/ A&P ?  ?                        ?Medical Decision Making ?Amount and/or Complexity of Data Reviewed ?Labs: ordered. ?Radiology: ordered. ? ? ?Chart review shows a history of epistaxis January 2023. ? ?Patient on cardiac monitor showing sinus rhythm. ? ?Son at bedside providing additional history. ? ?Vitals on arrival within normal limits 99% on room air which is appropriate patient appears comfortable otherwise. ? ?Labs are sent chemistry remarkable hemoglobin 10.8.  Urinalysis continues show leukocytes and small amount of bacteria.  Patient advised to continue her Cipro and finish the course. ? ?Chest x-ray shows no infiltrate effusion or edema per my 1 view chest x-ray read.  Radiologist read is consistent. ? ?Advising outpatient follow-up with her doctor within the week.  Recommending immediate return for worsening symptoms fevers pain or any additional concerns. ? ? ? ? ? ? ? ?Final Clinical Impression(s) / ED Diagnoses ?Final diagnoses:  ?Epistaxis  ?Urinary tract infection with hematuria, site unspecified  ? ? ?Rx / DC Orders ?ED Discharge Orders   ? ? None  ? ?  ? ? ?  ?Cheryll Cockayne, MD ?04/11/21 1557 ? ?

## 2021-04-11 NOTE — ED Triage Notes (Signed)
Pt BIBA from [facility] for hemoptysis likely d/t nosebleed. Hx of nosebleeds. Not actively bleeding. Recently dx with UTI, reports blood with wiping. Lungs clear, sats good, VSS. A&Ox3. Arrives with c-collar in place from previous fracture.  ?

## 2021-05-08 ENCOUNTER — Encounter (HOSPITAL_COMMUNITY): Payer: Self-pay | Admitting: Emergency Medicine

## 2021-05-08 ENCOUNTER — Emergency Department (HOSPITAL_COMMUNITY): Payer: Medicare Other

## 2021-05-08 ENCOUNTER — Emergency Department (HOSPITAL_COMMUNITY)
Admission: EM | Admit: 2021-05-08 | Discharge: 2021-05-08 | Disposition: A | Discharge status: Home / Self Care | Payer: Medicare Other | Attending: Emergency Medicine | Admitting: Emergency Medicine

## 2021-05-08 ENCOUNTER — Other Ambulatory Visit: Payer: Self-pay

## 2021-05-08 DIAGNOSIS — S0990XA Unspecified injury of head, initial encounter: Secondary | ICD-10-CM

## 2021-05-08 DIAGNOSIS — S0181XA Laceration without foreign body of other part of head, initial encounter: Secondary | ICD-10-CM | POA: Diagnosis not present

## 2021-05-08 DIAGNOSIS — W19XXXA Unspecified fall, initial encounter: Secondary | ICD-10-CM

## 2021-05-08 MED ORDER — LIDOCAINE-EPINEPHRINE (PF) 2 %-1:200000 IJ SOLN
10.0000 mL | Freq: Once | INTRAMUSCULAR | Status: AC
  Administered 2021-05-08: 10 mL
  Filled 2021-05-08: qty 20

## 2021-05-08 NOTE — Discharge Instructions (Signed)
Have the stitches taken out in around 5 to 7 days. ?

## 2021-05-08 NOTE — ED Notes (Signed)
Report given to [facility].  ?

## 2021-05-08 NOTE — ED Provider Notes (Signed)
?[facility]-EMERGENCY DEPT ?Provider Note ? ? ?CSN: [id number] ?Arrival date & time: 05/08/21  1933 ? ?  ? ?History ? ?Chief Complaint  ?Patient presents with  ? Fall  ? Headache  ? ? ?Diane Nolan is a 88 y.o. female. ? ? ?Fall ?Associated symptoms include headaches.  ?Headache ?Patient presents after fall.  History of unsteadiness.  Hit her head.  Laceration to right scalp.  No neck pain.  States she feels good and does not really want to be here.  May have had loss of consciousness with the fall but states did not pass out before the fall.  Not on blood thinners.  Has had previous cervical spine fracture.  Patient states she does not normally wear the collar but presents with cervical collar in place. ?  ? ?Home Medications ?Prior to Admission medications   ?Medication Sig Start Date End Date Taking? Authorizing Provider  ?acetaminophen (TYLENOL) 650 MG CR tablet Take 1 tablet (650 mg total) by mouth every 6 (six) hours as needed for pain. 07/20/20   Hongalgi, Maximino Greenland, MD  ?Alum & Mag Hydroxide-Simeth (MYLANTA PO) Take 30 mLs by mouth every 2 (two) hours as needed (indigestion).    [provider]  ?Boswellia-Glucosamine-Vit D (OSTEO BI-FLEX ONE PER DAY PO) Take 2 tablets by mouth every morning.    [provider]  ?Cholecalciferol (VITAMIN D3) 1.25 MG (50000 UT) CAPS Take 50,000 Units by mouth every Friday.    [provider]  ?ciprofloxacin (CIPRO) 500 MG tablet Take 500 mg by mouth 2 (two) times daily. ?Patient not taking: Reported on 04/11/2021    [provider]  ?docusate sodium (COLACE) 100 MG capsule Take 100 mg by mouth 2 (two) times daily.    [provider]  ?ferrous sulfate (FEROSUL) 325 (65 FE) MG tablet Take 325 mg by mouth 2 (two) times daily.    [provider]  ?levothyroxine (SYNTHROID) 25 MCG tablet Take 25 mcg by mouth daily at 6 (six) AM.    [provider]  ?LORazepam (ATIVAN) 1 MG tablet Take 1 tablet (1 mg  total) by mouth at bedtime. 07/20/20   Hongalgi, Maximino Greenland, MD  ?metoprolol succinate (TOPROL-XL) 25 MG 24 hr tablet Take 25 mg by mouth every morning. 11/20/15   [provider]  ?mirtazapine (REMERON) 15 MG tablet Take 15 mg by mouth at bedtime.    [provider]  ?Nutritional Supplements (NEPRO PO) Take 1 each by mouth 3 (three) times daily.    [provider]  ?Nutritional Supplements (NUTRITIONAL SUPPLEMENT PO) Take 1 each by mouth See admin instructions. Take one magic cup or might shake as available by mouth daily with lunch and supper due to weight loss    [provider]  ?oxymetazoline (AFRIN) 0.05 % nasal spray Place 2 sprays into both nostrils every 12 (twelve) hours as needed (nose bleed). Do not use for more than 3 consecutive days    [provider]  ?polyethylene glycol (MIRALAX / GLYCOLAX) 17 g packet Take 17 g by mouth every morning. Mix in 4-8 oz water and drink    [provider]  ?sertraline (ZOLOFT) 25 MG tablet Take 25 mg by mouth at bedtime.    [provider]  ?simvastatin (ZOCOR) 20 MG tablet Take 20 mg by mouth daily at 6 (six) AM.    [provider]  ?sodium chloride (OCEAN) 0.65 % SOLN nasal spray Place 1 spray into both nostrils at bedtime.  [provider]  ?traMADol (ULTRAM) 50 MG tablet Take 1 tablet (50 mg total) by mouth 2 (two) times daily. 07/20/20   Hongalgi, Maximino GreenlandAnand D, MD  ?vitamin B-12 (CYANOCOBALAMIN) 1000 MCG tablet Take 1 tablet (1,000 mcg total) by mouth daily. ?Patient taking differently: Take 1,000 mcg by mouth every morning. 07/21/20   Hongalgi, Maximino GreenlandAnand D, MD  ?vitamin C (ASCORBIC ACID) 500 MG tablet Take 500 mg by mouth 2 (two) times daily.    [provider]  ?   ? ?Allergies    ?Patient has no known allergies.   ? ?Review of Systems   ?Review of Systems  ?Constitutional:  Negative for appetite change.  ?Skin:  Positive for wound.  ?Neurological:  Positive for headaches.  ? ?Physical  Exam ?Updated Vital Signs ?BP (!) 124/57   Pulse 81   Temp 98.1 ?F (36.7 ?C) (Oral)   Resp 16   SpO2 100%  ?Physical Exam ?HENT:  ?   Head:  ?   Comments: Laceration to right periorbital area.  Approximately 3 to 4 cm long.  Somewhat irregular.  Steri-Strips initially in place.  Eye movements intact. ?Neck:  ?   Comments: Cervical collar initially in place.  No cervical spine tenderness. ?Cardiovascular:  ?   Rate and Rhythm: Regular rhythm.  ?Musculoskeletal:  ?   Cervical back: Neck supple.  ?Skin: ?   General: Skin is warm.  ?Neurological:  ?   Mental Status: She is alert. Mental status is at baseline.  ?Psychiatric:     ?   Mood and Affect: Mood normal.  ? ? ?ED Results / Procedures / Treatments   ?Labs ?(all labs ordered are listed, but only abnormal results are displayed) ?Labs Reviewed - No data to display ? ?EKG ?None ? ?Radiology ?CT HEAD WO CONTRAST (5MM) ? ?Result Date: 05/08/2021 ?CLINICAL DATA:  Neck trauma (Age >= 65y); Head trauma, minor (Age >= 65y. Right-sided laceration. Neck pain.) EXAM: CT HEAD WITHOUT CONTRAST CT CERVICAL SPINE WITHOUT CONTRAST TECHNIQUE: Multidetector CT imaging of the head and cervical spine was performed following the standard protocol without intravenous contrast. Multiplanar CT image reconstructions of the cervical spine were also generated. RADIATION DOSE REDUCTION: This exam was performed according to the departmental dose-optimization program which includes automated exposure control, adjustment of the mA and/or kV according to patient size and/or use of iterative reconstruction technique. COMPARISON:  MRI head 07/15/2020, CT head 07/13/2020 FINDINGS: CT HEAD FINDINGS BRAIN: BRAIN Cerebral ventricle sizes are concordant with the degree of cerebral volume loss. No evidence of large-territorial acute infarction. No parenchymal hemorrhage. No mass lesion. No extra-axial collection. No mass effect or midline shift. No hydrocephalus. Basilar cisterns are patent. Vascular:  No hyperdense vessel. Skull: No acute fracture or focal lesion. Sinuses/Orbits: Sphenoid sinus mucosal thickening. Paranasal sinuses and mastoid air cells are clear. Right lens replacement. The orbits are unremarkable. Other: No large hematoma formation. CT CERVICAL SPINE FINDINGS Alignment: Normal. 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 Multilevel severe degenerative changes of the spine with associated multilevel severe osseous neural foraminal stenosis. No acute fracture. No aggressive appearing focal osseous lesion or focal pathologic process. Soft tissues and spinal canal: No prevertebral fluid or swelling. No visible canal hematoma. Upper chest: Biapical pleural/pulmonary scarring. Other: None. IMPRESSION: 1. No acute intracranial abnormality. 2. 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 3. Multilevel severe degenerative  changes of the spine with associated multilevel severe osseous neural foraminal stenosis. Electronically Signed   By: Tish Frederickson M.D.   On: 05/08/2021 20:12  ? ?CT Cervical Spine Wo Contrast ? ?Result Date: 05/08/2021 ?CLINICAL DATA:  Neck trauma (Age >= 65y); Head trauma, minor (Age >= 65y. Right-sided laceration. Neck pain.) EXAM: CT HEAD WITHOUT CONTRAST CT CERVICAL SPINE WITHOUT CONTRAST TECHNIQUE: Multidetector CT imaging of the head and cervical spine was performed following the standard protocol without intravenous contrast. Multiplanar CT image reconstructions of the cervical spine were also generated. RADIATION DOSE REDUCTION: This exam was performed according to the departmental dose-optimization program which includes automated exposure control, adjustment of the mA and/or kV according to patient size and/or use of iterative reconstruction technique. COMPARISON:  MRI head 07/15/2020, CT head  07/13/2020 FINDINGS: CT HEAD FINDINGS BRAIN: BRAIN Cerebral ventricle sizes are concordant with the degree of cerebral volume loss. No evidence of large-territorial acute infarction. No parenchymal hemorrhage. No m

## 2021-05-08 NOTE — ED Triage Notes (Signed)
BIBA ?Per EMS: Pt coming from [facility] w / c/o fall and headache. Pt did Hit head.  ?Denies LOC, denies thinners.  ?R side head lac  ?Denies neck pain  ?C-collar in place  ?VSS  ?

## 2021-05-08 NOTE — ED Notes (Signed)
[facility] called for transport needs  

## 2021-07-04 ENCOUNTER — Emergency Department (HOSPITAL_COMMUNITY)
Admission: EM | Admit: 2021-07-04 | Discharge: 2021-07-05 | Disposition: A | Discharge status: Skilled Nursing Facility | Payer: Medicare Other | Attending: Emergency Medicine | Admitting: Emergency Medicine

## 2021-07-04 ENCOUNTER — Other Ambulatory Visit: Payer: Self-pay

## 2021-07-04 ENCOUNTER — Encounter (HOSPITAL_COMMUNITY): Payer: Self-pay | Admitting: *Deleted

## 2021-07-04 DIAGNOSIS — Z79899 Other long term (current) drug therapy: Secondary | ICD-10-CM | POA: Insufficient documentation

## 2021-07-04 DIAGNOSIS — R040 Epistaxis: Secondary | ICD-10-CM | POA: Diagnosis not present

## 2021-07-04 MED ORDER — OXYMETAZOLINE HCL 0.05 % NA SOLN
1.0000 | Freq: Once | NASAL | Status: AC
  Administered 2021-07-04: 1 via NASAL
  Filled 2021-07-04: qty 30

## 2021-07-04 NOTE — Discharge Instructions (Signed)
You had a nose bleed which stopped spontaneously. Nose bleeds can recur however, so please read the instructions below.  The ENT doctor has prescribed you to receive moisturizing nasal spray 3 times a day, make sure you are getting it 3 times a day to prevent additional bleeding.  Also, make sure that you are not touching your nose with your fingers to irritate already injured area.  If the bleeding recurs, please apply direct pressure to the nose for 5 minutes straight, breathing from the mouth and spitting out any blood. After 5 minutes of holding pressure, if the bleeding continues, do the same thing again for 5 more minutes. If after 2nd round of holding pressure the bleeding persists - clear the nose and apply afrin spray. After applying afrin spray to the nares, hold pressure again for 5 minutes. If the bleeding continues despite these measures, then come to the ER while holding direct pressure to the nares.  For now - please do not blow your nose, or put fingers in your nose to clear up any clots.

## 2021-07-04 NOTE — ED Provider Notes (Signed)
[facility] EMERGENCY DEPARTMENT Provider Note   CSN: [id number] Arrival date & time: 07/04/21  1548     History  Chief Complaint  Patient presents with   Epistaxis    Diane Nolan is a 88 y.o. female.  HPI    88 year old female comes in with chief complaint of epistaxis.  Patient has history of epistaxis, last seen few days back for same complaint.  Patient states that she suddenly started having bleeding from her nose.  Patient states that she felt something in her nose that was irritating her, and did put finger in her nose prior to the bleeding starting.  According to EMS, bleeding stopped when they arrived.  Patient is not on any blood thinners.   Home Medications Prior to Admission medications   Medication Sig Start Date End Date Taking? Authorizing Provider  acetaminophen (TYLENOL) 650 MG CR tablet Take 1 tablet (650 mg total) by mouth every 6 (six) hours as needed for pain. 07/20/20  Yes Hongalgi, Maximino Greenland, MD  Alum & Mag Hydroxide-Simeth (MYLANTA PO) Take 30 mLs by mouth every 2 (two) hours as needed (indigestion).   Yes [provider]  Boswellia-Glucosamine-Vit D (OSTEO BI-FLEX ONE PER DAY PO) Take 2 tablets by mouth every morning.   Yes [provider]  Cholecalciferol (VITAMIN D3) 1.25 MG (50000 UT) CAPS Take 50,000 Units by mouth every Friday.   Yes [provider]  docusate sodium (COLACE) 100 MG capsule Take 100 mg by mouth 2 (two) times daily.   Yes [provider]  ferrous sulfate (FEROSUL) 325 (65 FE) MG tablet Take 325 mg by mouth 2 (two) times daily.   Yes [provider]  furosemide (LASIX) 40 MG tablet Take 40 mg by mouth daily. 06/21/21  Yes [provider]  levothyroxine (SYNTHROID) 25 MCG tablet Take 25 mcg by mouth daily at 6 (six) AM.   Yes [provider]  LORazepam (ATIVAN) 1 MG tablet Take 1 tablet (1 mg total) by mouth at bedtime. 07/20/20  Yes Hongalgi, Maximino Greenland, MD   metoprolol succinate (TOPROL-XL) 25 MG 24 hr tablet Take 25 mg by mouth every morning. 11/20/15  Yes [provider]  mupirocin ointment (BACTROBAN) 2 % Apply 1 Application topically 2 (two) times daily. Both sides of nose 06/23/21  Yes [provider]  Nutritional Supplements (NEPRO PO) Take 1 each by mouth 3 (three) times daily.   Yes [provider]  Nutritional Supplements (NUTRITIONAL SUPPLEMENT PO) Take 1 each by mouth See admin instructions. Take one magic cup or might shake as available by mouth daily with lunch and supper due to weight loss   Yes [provider]  oxymetazoline (AFRIN) 0.05 % nasal spray Place 2 sprays into both nostrils every 12 (twelve) hours as needed (nose bleed). Do not use for more than 3 consecutive days   Yes [provider]  polyethylene glycol (MIRALAX / GLYCOLAX) 17 g packet Take 17 g by mouth every morning. Mix in 4-8 oz water and drink   Yes [provider]  potassium chloride SA (KLOR-CON M) 20 MEQ tablet Take 20 mEq by mouth daily. 06/21/21  Yes [provider]  sertraline (ZOLOFT) 25 MG tablet Take 25 mg by mouth at bedtime.   Yes [provider]  simvastatin (ZOCOR) 20 MG tablet Take 20 mg by mouth daily at 6 (six) AM.   Yes [provider]  sodium chloride (OCEAN) 0.65 % SOLN nasal spray Place 1 spray  into both nostrils at bedtime.   Yes [provider]  traMADol (ULTRAM) 50 MG tablet Take 1 tablet (50 mg total) by mouth 2 (two) times daily. Patient taking differently: Take 25 mg by mouth 2 (two) times daily. 07/20/20  Yes Hongalgi, Maximino Greenland, MD  vitamin B-12 (CYANOCOBALAMIN) 1000 MCG tablet Take 1 tablet (1,000 mcg total) by mouth daily. Patient taking differently: Take 1,000 mcg by mouth every morning. 07/21/20  Yes Hongalgi, Maximino Greenland, MD  vitamin C (ASCORBIC ACID) 500 MG tablet Take 500 mg by mouth 2 (two) times daily.   Yes [provider]  mirtazapine (REMERON) 15  MG tablet Take 15 mg by mouth at bedtime. Patient not taking: Reported on 07/04/2021    [provider]      Allergies    Patient has no known allergies.    Review of Systems   Review of Systems  All other systems reviewed and are negative.   Physical Exam Updated Vital Signs BP (!) 108/59   Pulse 79   Temp 98 F (36.7 C) (Oral)   Ht 5' 0.5" (1.537 m)   Wt 49.9 kg   SpO2 100%   BMI 21.13 kg/m  Physical Exam Vitals and nursing note reviewed.  Constitutional:      Appearance: She is well-developed.  HENT:     Head: Atraumatic.     Nose:     Comments: Dried blood seen in both nares, right worse than left Cardiovascular:     Rate and Rhythm: Normal rate.  Pulmonary:     Effort: Pulmonary effort is normal.  Musculoskeletal:     Cervical back: Normal range of motion and neck supple.  Skin:    General: Skin is warm and dry.  Neurological:     Mental Status: She is alert and oriented to person, place, and time.     ED Results / Procedures / Treatments   Labs (all labs ordered are listed, but only abnormal results are displayed) Labs Reviewed - No data to display  EKG None  Radiology No results found.  Procedures Procedures    Medications Ordered in ED Medications - No data to display  ED Course/ Medical Decision Making/ A&P                           Medical Decision Making  88 year old female comes in with chief complaint of nosebleed.  Her bleeding stopped in route, however the bleeding it persisted for about an hour.  I reviewed patient's medications and recent visit.  I have gotten history from patient's son and the nursing home.  Patient is not on any blood thinners.  Possible traumatic epistaxis. Patient is already seen ENT per son.  Patient was started on nasal saline spray.  Per nursing home documentation, patient has been receiving the spray.  She was also given Afrin spray by the ER, which he does not think was used today.  Anyways,  at this time patient is stable.  We have monitored the patient for over 2 hours and there has not been any rebleed.  Stable for discharge.  Precautions and discharge instructions discussed with the patient and the son.  Final Clinical Impression(s) / ED Diagnoses Final diagnoses:  Anterior epistaxis    Rx / DC Orders ED Discharge Orders     None         Derwood Kaplan, MD 07/04/21 1817

## 2021-07-04 NOTE — ED Notes (Signed)
Pt up for discharge.  Son no longer in room.  RN called son and notified that pt would have to wait several hours if she took [facility] transport and son stated he did not feel comfortable taking pt back to [facility].

## 2021-07-04 NOTE — ED Notes (Signed)
EDP notified that patient's epistaxis started again at right side.

## 2021-07-04 NOTE — ED Triage Notes (Addendum)
Hx of epistaxis.  Family states nose has been bleeding x 1 hour.  When EMS arrived, bleeding had stopped.  However, family requested transport.  Hx of dementia.  Rr 18 Bp 118/66 Spot 98 Hr 99

## 2021-07-04 NOTE — ED Notes (Signed)
Meal tray ordered 

## 2021-07-05 NOTE — ED Notes (Addendum)
This RN called [facility] for transport 

## 2021-07-05 NOTE — ED Notes (Addendum)
Assisted patient to the bathroom. Patient is now back in bed resting.  

## 2021-07-05 NOTE — ED Notes (Signed)
Pt nosebleed is controlled

## 2021-11-24 ENCOUNTER — Other Ambulatory Visit (HOSPITAL_COMMUNITY): Payer: Self-pay | Admitting: *Deleted

## 2021-11-25 ENCOUNTER — Ambulatory Visit (HOSPITAL_COMMUNITY)
Admission: RE | Admit: 2021-11-25 | Discharge: 2021-11-25 | Disposition: A | Discharge status: Home / Self Care | Payer: Medicare Other | Source: Ambulatory Visit | Attending: Nephrology | Admitting: Nephrology

## 2021-11-25 DIAGNOSIS — D631 Anemia in chronic kidney disease: Secondary | ICD-10-CM | POA: Insufficient documentation

## 2021-11-25 DIAGNOSIS — N189 Chronic kidney disease, unspecified: Secondary | ICD-10-CM | POA: Insufficient documentation

## 2021-11-25 MED ORDER — SODIUM CHLORIDE 0.9 % IV SOLN
750.0000 mg | INTRAVENOUS | Status: DC
  Administered 2021-11-25: 750 mg via INTRAVENOUS
  Filled 2021-11-25: qty 750

## 2021-12-02 ENCOUNTER — Ambulatory Visit (HOSPITAL_COMMUNITY)
Admission: RE | Admit: 2021-12-02 | Discharge: 2021-12-02 | Disposition: A | Discharge status: Home / Self Care | Payer: Medicare Other | Source: Ambulatory Visit | Attending: Nephrology | Admitting: Nephrology

## 2021-12-02 DIAGNOSIS — D631 Anemia in chronic kidney disease: Secondary | ICD-10-CM | POA: Insufficient documentation

## 2021-12-02 DIAGNOSIS — N189 Chronic kidney disease, unspecified: Secondary | ICD-10-CM | POA: Diagnosis not present

## 2021-12-02 MED ORDER — SODIUM CHLORIDE 0.9 % IV SOLN
750.0000 mg | INTRAVENOUS | Status: DC
  Administered 2021-12-02: 750 mg via INTRAVENOUS
  Filled 2021-12-02: qty 750

## 2022-04-04 ENCOUNTER — Encounter: Payer: Self-pay | Admitting: Obstetrics & Gynecology

## 2022-04-04 ENCOUNTER — Ambulatory Visit (INDEPENDENT_AMBULATORY_CARE_PROVIDER_SITE_OTHER): Payer: 59 | Admitting: Obstetrics & Gynecology

## 2022-04-04 ENCOUNTER — Other Ambulatory Visit: Payer: Self-pay

## 2022-04-04 VITALS — BP 106/51 | HR 52 | Ht 60.0 in | Wt 122.0 lb

## 2022-04-04 DIAGNOSIS — N950 Postmenopausal bleeding: Secondary | ICD-10-CM | POA: Diagnosis not present

## 2022-04-04 DIAGNOSIS — T8389XA Other specified complication of genitourinary prosthetic devices, implants and grafts, initial encounter: Secondary | ICD-10-CM

## 2022-04-04 DIAGNOSIS — N898 Other specified noninflammatory disorders of vagina: Secondary | ICD-10-CM

## 2022-04-04 MED ORDER — ESTRADIOL 0.1 MG/GM VA CREA: TOPICAL_CREAM | VAGINAL | 12 refills | Status: AC

## 2022-04-04 NOTE — Progress Notes (Signed)
Patient ID: Diane Nolan, female   DOB: 1928-05-09, 89 y.o.   MRN: [id number]  Chief Complaint  Patient presents with   Gynecologic Exam    HPI Diane Nolan is a 89 y.o. female.  G1P1 No LMP recorded. Patient is postmenopausal. She has had several months of vaginal bleeding that has been evaluated by urology with negative cystoscopy but no gyn referral. Bleeding persists, no pain. She comes with her son. HPI  Past Medical History:  Diagnosis Date   Diabetes mellitus    HLD (hyperlipidemia)    Hypertension    Intracervical pessary    in place-dr henley manages    Past Surgical History:  Procedure Laterality Date   CHILD BIRTH  X1   CHOLECYSTECTOMY  06/01/2011   Procedure: LAPAROSCOPIC CHOLECYSTECTOMY WITH INTRAOPERATIVE CHOLANGIOGRAM;  Surgeon: Joyice Faster. Cornett, MD;  Location: [place];  Service: General;  Laterality: N/A;  Laparoscopic cholecystectomy with cholangiogram    Family History  Problem Relation Age of Onset   Other Mother        Old Age   Diabetes Father     Social History Social History   Tobacco Use   Smoking status: Never   Smokeless tobacco: Never  Substance Use Topics   Alcohol use: No   Drug use: No    No Known Allergies  Current Outpatient Medications  Medication Sig Dispense Refill   acetaminophen (TYLENOL) 650 MG CR tablet Take 1 tablet (650 mg total) by mouth every 6 (six) hours as needed for pain.     Alum & Mag Hydroxide-Simeth (MYLANTA PO) Take 30 mLs by mouth every 2 (two) hours as needed (indigestion).     Boswellia-Glucosamine-Vit D (OSTEO BI-FLEX ONE PER DAY PO) Take 2 tablets by mouth every morning.     Cholecalciferol (VITAMIN D3) 1.25 MG (50000 UT) CAPS Take 50,000 Units by mouth every Friday.     docusate sodium (COLACE) 100 MG capsule Take 100 mg by mouth 2 (two) times daily.     ferrous sulfate (FEROSUL) 325 (65 FE) MG tablet Take 325 mg by mouth 2 (two) times daily.     furosemide (LASIX) 40 MG tablet Take 40  mg by mouth daily.     levothyroxine (SYNTHROID) 25 MCG tablet Take 25 mcg by mouth daily at 6 (six) AM.     LORazepam (ATIVAN) 1 MG tablet Take 1 tablet (1 mg total) by mouth at bedtime. 5 tablet 0   metoprolol succinate (TOPROL-XL) 25 MG 24 hr tablet Take 25 mg by mouth every morning.     mirtazapine (REMERON) 15 MG tablet Take 15 mg by mouth at bedtime. (Patient not taking: Reported on 07/04/2021)     mupirocin ointment (BACTROBAN) 2 % Apply 1 Application topically 2 (two) times daily. Both sides of nose     Nutritional Supplements (NEPRO PO) Take 1 each by mouth 3 (three) times daily.     Nutritional Supplements (NUTRITIONAL SUPPLEMENT PO) Take 1 each by mouth See admin instructions. Take one magic cup or might shake as available by mouth daily with lunch and supper due to weight loss     oxymetazoline (AFRIN) 0.05 % nasal spray Place 2 sprays into both nostrils every 12 (twelve) hours as needed (nose bleed). Do not use for more than 3 consecutive days     polyethylene glycol (MIRALAX / GLYCOLAX) 17 g packet Take 17 g by mouth every morning. Mix in 4-8 oz water and drink     potassium chloride SA (  KLOR-CON M) 20 MEQ tablet Take 20 mEq by mouth daily.     sertraline (ZOLOFT) 25 MG tablet Take 25 mg by mouth at bedtime.     simvastatin (ZOCOR) 20 MG tablet Take 20 mg by mouth daily at 6 (six) AM.     sodium chloride (OCEAN) 0.65 % SOLN nasal spray Place 1 spray into both nostrils at bedtime.     traMADol (ULTRAM) 50 MG tablet Take 1 tablet (50 mg total) by mouth 2 (two) times daily. (Patient taking differently: Take 25 mg by mouth 2 (two) times daily.) 10 tablet 0   vitamin B-12 (CYANOCOBALAMIN) 1000 MCG tablet Take 1 tablet (1,000 mcg total) by mouth daily. (Patient taking differently: Take 1,000 mcg by mouth every morning.)     vitamin C (ASCORBIC ACID) 500 MG tablet Take 500 mg by mouth 2 (two) times daily.     No current facility-administered medications for this visit.    Review of  Systems Review of Systems  Constitutional: Negative.   Respiratory: Negative.    Cardiovascular: Negative.   Gastrointestinal: Negative.   Genitourinary:  Positive for vaginal bleeding and vaginal discharge. Negative for pelvic pain.    Blood pressure (!) 106/51, pulse (!) 52, height 5' (1.524 m), weight 55.3 kg.  Physical Exam Physical Exam Vitals and nursing note reviewed. Exam conducted with a chaperone present.  Constitutional:      Appearance: She is not ill-appearing.  Pulmonary:     Effort: Pulmonary effort is normal.  Genitourinary:    General: Normal vulva.     Exam position: Lithotomy position.     Vagina: Vaginal discharge (bleeding) and bleeding present.     Comments: Ring pessary no support in vagina which cannot be removed due to vaginal adhesions. Lidocaine gel applied by Dr.  Currie Paris who also attempted removal Skin:    General: Skin is warm and dry.  Neurological:     Mental Status: She is alert.  Psychiatric:        Mood and Affect: Mood normal.        Behavior: Behavior normal.     Data Reviewed   Assessment Postmenopausal vaginal bleeding - Plan: estradiol (ESTRACE) 0.1 MG/GM vaginal cream  Vaginal erosion secondary to pessary use, initial encounter ([place])   Plan Meds ordered this encounter  Medications   estradiol (ESTRACE) 0.1 MG/GM vaginal cream    Sig: Apply 1 gram per vagina every night for 4 weeks, then apply two to three times a week    Dispense:  30 g    Refill:  12  RTC 1 month     [name] 04/04/2022, 2:46 PM

## 2022-04-11 ENCOUNTER — Encounter (HOSPITAL_COMMUNITY): Payer: Self-pay

## 2022-04-11 ENCOUNTER — Other Ambulatory Visit: Payer: Self-pay

## 2022-04-11 ENCOUNTER — Emergency Department (HOSPITAL_COMMUNITY)
Admission: EM | Admit: 2022-04-11 | Discharge: 2022-04-12 | Disposition: A | Discharge status: Skilled Nursing Facility | Payer: 59 | Attending: Emergency Medicine | Admitting: Emergency Medicine

## 2022-04-11 DIAGNOSIS — R040 Epistaxis: Secondary | ICD-10-CM | POA: Diagnosis not present

## 2022-04-11 DIAGNOSIS — F0390 Unspecified dementia without behavioral disturbance: Secondary | ICD-10-CM | POA: Insufficient documentation

## 2022-04-11 DIAGNOSIS — Z79899 Other long term (current) drug therapy: Secondary | ICD-10-CM | POA: Diagnosis not present

## 2022-04-11 MED ORDER — OXYMETAZOLINE HCL 0.05 % NA SOLN
1.0000 | Freq: Once | NASAL | Status: AC
  Administered 2022-04-11: 1 via NASAL
  Filled 2022-04-11: qty 30

## 2022-04-11 NOTE — ED Provider Notes (Signed)
[facility] Provider Note   CSN: [id number] Arrival date & time: 04/11/22  1638     History  Chief Complaint  Patient presents with   Epistaxis    Diane Nolan is a 89 y.o. female.  89 year old female with history dementia presents from facility due to bleeding from her right nose.  Unknown how this occurred.  Does not appear to take any blood thinners.  Called EMS and given Afrin without relief.  No further history obtainable       Home Medications Prior to Admission medications   Medication Sig Start Date End Date Taking? Authorizing Provider  acetaminophen (TYLENOL) 650 MG CR tablet Take 1 tablet (650 mg total) by mouth every 6 (six) hours as needed for pain. 07/20/20   Hongalgi, Lenis Dickinson, MD  Alum & Mag Hydroxide-Simeth (MYLANTA PO) Take 30 mLs by mouth every 2 (two) hours as needed (indigestion).    [provider]  Boswellia-Glucosamine-Vit D (OSTEO BI-FLEX ONE PER DAY PO) Take 2 tablets by mouth every morning.    [provider]  Cholecalciferol (VITAMIN D3) 1.25 MG (50000 UT) CAPS Take 50,000 Units by mouth every Friday.    [provider]  docusate sodium (COLACE) 100 MG capsule Take 100 mg by mouth 2 (two) times daily.    [provider]  estradiol (ESTRACE) 0.1 MG/GM vaginal cream Apply 1 gram per vagina every night for 4 weeks, then apply two to three times a week 04/04/22   Darliss Cheney, MD  ferrous sulfate (FEROSUL) 325 (65 FE) MG tablet Take 325 mg by mouth 2 (two) times daily.    [provider]  furosemide (LASIX) 40 MG tablet Take 40 mg by mouth daily. 06/21/21   [provider]  levothyroxine (SYNTHROID) 25 MCG tablet Take 25 mcg by mouth daily at 6 (six) AM.    [provider]  LORazepam (ATIVAN) 1 MG tablet Take 1 tablet (1 mg total) by mouth at bedtime. 07/20/20   Hongalgi, Lenis Dickinson, MD  metoprolol succinate (TOPROL-XL) 25 MG 24 hr tablet Take 25 mg by  mouth every morning. 11/20/15   [provider]  mirtazapine (REMERON) 15 MG tablet Take 15 mg by mouth at bedtime. Patient not taking: Reported on 07/04/2021    [provider]  mupirocin ointment (BACTROBAN) 2 % Apply 1 Application topically 2 (two) times daily. Both sides of nose 06/23/21   [provider]  Nutritional Supplements (NEPRO PO) Take 1 each by mouth 3 (three) times daily.    [provider]  Nutritional Supplements (NUTRITIONAL SUPPLEMENT PO) Take 1 each by mouth See admin instructions. Take one magic cup or might shake as available by mouth daily with lunch and supper due to weight loss    [provider]  oxymetazoline (AFRIN) 0.05 % nasal spray Place 2 sprays into both nostrils every 12 (twelve) hours as needed (nose bleed). Do not use for more than 3 consecutive days    [provider]  polyethylene glycol (MIRALAX / GLYCOLAX) 17 g packet Take 17 g by mouth every morning. Mix in 4-8 oz water and drink    [provider]  potassium chloride SA (KLOR-CON M) 20 MEQ tablet Take 20 mEq by mouth daily. 06/21/21   [provider]  sertraline (ZOLOFT) 25 MG tablet Take 25 mg by mouth at bedtime.    [provider]  simvastatin (ZOCOR) 20 MG tablet Take 20 mg by mouth daily  at 6 (six) AM.    [provider]  sodium chloride (OCEAN) 0.65 % SOLN nasal spray Place 1 spray into both nostrils at bedtime.    [provider]  traMADol (ULTRAM) 50 MG tablet Take 1 tablet (50 mg total) by mouth 2 (two) times daily. Patient taking differently: Take 25 mg by mouth 2 (two) times daily. 07/20/20   Hongalgi, Lenis Dickinson, MD  vitamin B-12 (CYANOCOBALAMIN) 1000 MCG tablet Take 1 tablet (1,000 mcg total) by mouth daily. Patient taking differently: Take 1,000 mcg by mouth every morning. 07/21/20   Hongalgi, Lenis Dickinson, MD  vitamin C (ASCORBIC ACID) 500 MG tablet Take 500 mg by mouth 2 (two) times daily.    [provider]      Allergies    Patient has no known allergies.    Review of Systems   Review of Systems  All other systems reviewed and are negative.   Physical Exam Updated Vital Signs Ht 1.524 m (5')   Wt 55.3 kg   BMI 23.81 kg/m  Physical Exam Vitals and nursing note reviewed.  Constitutional:      General: She is not in acute distress.    Appearance: Normal appearance. She is well-developed. She is not toxic-appearing.  HENT:     Head: Normocephalic and atraumatic.     Nose:     Right Nostril: Epistaxis present.  Eyes:     General: Lids are normal.     Conjunctiva/sclera: Conjunctivae normal.     Pupils: Pupils are equal, round, and reactive to light.  Neck:     Thyroid: No thyroid mass.     Trachea: No tracheal deviation.  Cardiovascular:     Rate and Rhythm: Normal rate and regular rhythm.     Heart sounds: Normal heart sounds. No murmur heard.    No gallop.  Pulmonary:     Effort: Pulmonary effort is normal. No respiratory distress.     Breath sounds: Normal breath sounds. No stridor. No decreased breath sounds, wheezing, rhonchi or rales.  Abdominal:     General: There is no distension.     Palpations: Abdomen is soft.     Tenderness: There is no abdominal tenderness. There is no rebound.  Musculoskeletal:        General: No tenderness. Normal range of motion.     Cervical back: Normal range of motion and neck supple.  Skin:    General: Skin is warm and dry.     Findings: No abrasion or rash.  Neurological:     Mental Status: She is alert and oriented to person, place, and time. Mental status is at baseline.     GCS: GCS eye subscore is 4. GCS verbal subscore is 5. GCS motor subscore is 6.     Cranial Nerves: No cranial nerve deficit.     Sensory: No sensory deficit.     Motor: Motor function is intact.  Psychiatric:        Attention and Perception: Attention normal.        Speech: Speech normal.        Behavior: Behavior normal.     ED Results  / Procedures / Treatments   Labs (all labs ordered are listed, but only abnormal results are displayed) Labs Reviewed - No data to display  EKG None  Radiology No results found.  Procedures .Epistaxis Management  Date/Time: 04/11/2022 8:57 PM  Performed by: Lacretia Leigh, MD Authorized by: Lacretia Leigh, MD   Consent:  Consent obtained:  Verbal   Consent given by:  Patient   Alternatives discussed:  No treatment Universal protocol:    Immediately prior to procedure, a time out was called: yes     Patient identity confirmed:  Verbally with patient Anesthesia:    Anesthesia method:  None Procedure details:    Treatment site:  R posterior and L posterior   Treatment method:  Nasal balloon   Treatment complexity:  Extensive   Treatment episode: recurring   Post-procedure details:    Assessment:  Bleeding decreased Comments:     Patient had copious amounts of Afrin placed and clots cleared multiple times.  Did have Merocel placed but patient cannot tolerate it.  Removed Merocel and patient monitored for 2 hours and no further bleeding.     Medications Ordered in ED Medications - No data to display  ED Course/ Medical Decision Making/ A&P                             Medical Decision Making Risk OTC drugs.   Patient bleeding is controlled at this time.  Will discharge home        Final Clinical Impression(s) / ED Diagnoses Final diagnoses:  None    Rx / DC Orders ED Discharge Orders     None         Lacretia Leigh, MD 04/11/22 2058

## 2022-04-11 NOTE — ED Triage Notes (Signed)
89y/o female with hx of dementia arriving from [facility] via [place]. Chief complaint is nosebleed that started 1 hr prior to EMS arriving. Facility tried to get it under control but couldn't so they contacted EMS.4 squirts of Affrin were given in each nose and bleeding is still continued with clots at times. Last set of vitals were:BP: 134/60, P:68, O2:100%,rr:16 and CBG of 133

## 2022-05-03 ENCOUNTER — Ambulatory Visit (INDEPENDENT_AMBULATORY_CARE_PROVIDER_SITE_OTHER): Payer: 59 | Admitting: Obstetrics and Gynecology

## 2022-05-03 ENCOUNTER — Other Ambulatory Visit (HOSPITAL_COMMUNITY)
Admission: RE | Admit: 2022-05-03 | Discharge: 2022-05-03 | Disposition: A | Discharge status: Home / Self Care | Payer: 59 | Source: Ambulatory Visit | Attending: Obstetrics and Gynecology | Admitting: Obstetrics and Gynecology

## 2022-05-03 ENCOUNTER — Other Ambulatory Visit: Payer: Self-pay

## 2022-05-03 ENCOUNTER — Encounter: Payer: Self-pay | Admitting: Obstetrics and Gynecology

## 2022-05-03 VITALS — BP 103/41 | HR 56

## 2022-05-03 DIAGNOSIS — T8389XS Other specified complication of genitourinary prosthetic devices, implants and grafts, sequela: Secondary | ICD-10-CM

## 2022-05-03 DIAGNOSIS — N898 Other specified noninflammatory disorders of vagina: Secondary | ICD-10-CM | POA: Insufficient documentation

## 2022-05-03 NOTE — Progress Notes (Signed)
    GYNECOLOGY VISIT  Patient name: Diane Nolan MRN [id number]  Date of birth: [birth year] Chief Complaint:   Procedure  History:  Diane Nolan is a 89 y.o. G1P0 being seen today for started vaginal estrogen - bleeding has stopped but now having yellow discharge. No pain present.    Past Medical History:  Diagnosis Date   Diabetes mellitus    HLD (hyperlipidemia)    Hypertension    Intracervical pessary    in place-dr henley manages    Past Surgical History:  Procedure Laterality Date   CHILD BIRTH  X1   CHOLECYSTECTOMY  06/01/2011   Procedure: LAPAROSCOPIC CHOLECYSTECTOMY WITH INTRAOPERATIVE CHOLANGIOGRAM;  Surgeon: Clovis Pu. Cornett, MD;  Location: [facility];  Service: General;  Laterality: N/A;  Laparoscopic cholecystectomy with cholangiogram    The following portions of the patient's history were reviewed and updated as appropriate: allergies, current medications, past family history, past medical history, past social history, past surgical history and problem list.    Review of Systems:  Pertinent items are noted in HPI. Comprehensive review of systems was otherwise negative.   Objective:  Physical Exam BP (!) 103/41   Pulse (!) 56    Physical Exam Vitals and nursing note reviewed. Exam conducted with a chaperone present.  Constitutional:      Appearance: Normal appearance.  HENT:     Head: Normocephalic and atraumatic.  Pulmonary:     Effort: Pulmonary effort is normal.     Breath sounds: Normal breath sounds.  Genitourinary:    General: Normal vulva.     Exam position: Lithotomy position.     Vagina: Normal.     Comments: Atrophic vaginal introitus Yellow discharge at introitus Pessary palpated - significant pain with attempted removal Palpated vaginal adhesion surrounding pessary Skin:    General: Skin is warm and dry.  Neurological:     General: No focal deficit present.     Mental Status: She is alert.  Psychiatric:        Mood and  Affect: Mood normal.        Behavior: Behavior normal.        Thought Content: Thought content normal.        Judgment: Judgment normal.       Assessment & Plan:   1. Vaginal erosion secondary to pessary use, sequela Will leave pessary in place at this time and continue vaginal estrogen at this time. Patient not a surgical candidate for removal in the OR. Bleeding has resolved. Offered attempt at cutting pessary with removal vs. Remaining in place. After discussion, opt to remain in place since vaginal bleeding has resolved. Saline rinse through vagina. Pessary able to be rotated in place but thick adhesive band palpated. Will have follow up in 3 months to re-evaluate symptoms.   2. Vaginal discharge Vaginitis swab collected - Cervicovaginal ancillary only   Lorriane Shire, MD [facility], [facility]

## 2022-05-04 LAB — CERVICOVAGINAL ANCILLARY ONLY
Bacterial Vaginitis (gardnerella): NEGATIVE
Candida Glabrata: NEGATIVE
Candida Vaginitis: NEGATIVE
Comment: NEGATIVE
Comment: NEGATIVE
Comment: NEGATIVE

## 2022-05-05 ENCOUNTER — Telehealth: Payer: Self-pay

## 2022-05-05 NOTE — Telephone Encounter (Addendum)
-----   Message from Lorriane Shire, MD sent at 05/04/2022  6:18 PM EDT ----- Notify patient/caregiver that vaginitis negative. Will continue with estrogen cream and follow up in 3 months    Pt's son (power of attorney) notified of results and provider recommendations.   Leonette Nutting  05/05/22

## 2023-11-27 ENCOUNTER — Other Ambulatory Visit: Payer: Self-pay

## 2023-11-27 ENCOUNTER — Emergency Department (HOSPITAL_COMMUNITY)
Admission: EM | Admit: 2023-11-27 | Discharge: 2023-11-27 | Disposition: A | Discharge status: Skilled Nursing Facility | Source: Skilled Nursing Facility | Attending: Emergency Medicine | Admitting: Emergency Medicine

## 2023-11-27 ENCOUNTER — Encounter (HOSPITAL_COMMUNITY): Payer: Self-pay

## 2023-11-27 DIAGNOSIS — R040 Epistaxis: Secondary | ICD-10-CM | POA: Diagnosis present

## 2023-11-27 DIAGNOSIS — Z79899 Other long term (current) drug therapy: Secondary | ICD-10-CM | POA: Diagnosis not present

## 2023-11-27 HISTORY — DX: Unspecified dementia, unspecified severity, without behavioral disturbance, psychotic disturbance, mood disturbance, and anxiety: F03.90

## 2023-11-27 MED ORDER — ACETAMINOPHEN 325 MG PO TABS
650.0000 mg | ORAL_TABLET | Freq: Once | ORAL | Status: AC
  Administered 2023-11-27: 650 mg via ORAL
  Filled 2023-11-27: qty 2

## 2023-11-27 NOTE — ED Triage Notes (Addendum)
 Patient BIB [facility] from [facility]. Has had an on and off nosebleed since 11am today. This is a recurrent problem per staff. Has dementia. No blood thinners. Was wearing a c collar due to previous neck fracture. Is not currently bleeding right now.

## 2023-11-27 NOTE — Discharge Instructions (Signed)
 Apply bacitracin a couple times a day to just the inside of the nostril.  Try a humidifier in the room.  If bleeding reoccurs hold direct pressure for 15 min without stopping.  If this doesn't work then return to the ED for evaluation.

## 2023-11-27 NOTE — ED Provider Notes (Signed)
 [facility] EMERGENCY DEPARTMENT AT [facility] Provider Note   CSN: [id number] Arrival date & time: 11/27/23  1407     Patient presents with: Epistaxis   Diane Nolan is a 89 y.o. female.   89 yo F with a cc of nosebleed.  She said this started today.  Seem to have resolved.  She feels better would like to go home.  She denies any injury to the nose.  Has had nosebleeds in the past.   Epistaxis      Prior to Admission medications   Medication Sig Start Date End Date Taking? Authorizing Provider  acetaminophen  (TYLENOL ) 650 MG CR tablet Take 1 tablet (650 mg total) by mouth every 6 (six) hours as needed for pain. 07/20/20   Hongalgi, Anand D, MD  Alum & Mag Hydroxide-Simeth (MYLANTA PO) Take 30 mLs by mouth every 2 (two) hours as needed (indigestion).    [provider]  Boswellia-Glucosamine-Vit D (OSTEO BI-FLEX ONE PER DAY PO) Take 2 tablets by mouth every morning.    [provider]  Cholecalciferol (VITAMIN D3) 1.25 MG (50000 UT) CAPS Take 50,000 Units by mouth every Friday.    [provider]  docusate sodium (COLACE) 100 MG capsule Take 100 mg by mouth 2 (two) times daily.    [provider]  estradiol  (ESTRACE ) 0.1 MG/GM vaginal cream Apply 1 gram per vagina every night for 4 weeks, then apply two to three times a week 04/04/22   Ajewole, Christana, MD  ferrous sulfate (FEROSUL) 325 (65 FE) MG tablet Take 325 mg by mouth 2 (two) times daily.    [provider]  furosemide (LASIX) 40 MG tablet Take 40 mg by mouth daily. 06/21/21   [provider]  levothyroxine (SYNTHROID) 25 MCG tablet Take 25 mcg by mouth daily at 6 (six) AM.    [provider]  LORazepam  (ATIVAN ) 1 MG tablet Take 1 tablet (1 mg total) by mouth at bedtime. 07/20/20   Hongalgi, Anand D, MD  metoprolol  succinate (TOPROL -XL) 25 MG 24 hr tablet Take 25 mg by mouth every morning. 11/20/15   [provider]  mirtazapine (REMERON) 15 MG  tablet Take 15 mg by mouth at bedtime. Patient not taking: Reported on 07/04/2021    [provider]  mupirocin ointment (BACTROBAN) 2 % Apply 1 Application topically 2 (two) times daily. Both sides of nose 06/23/21   [provider]  Nutritional Supplements (NEPRO PO) Take 1 each by mouth 3 (three) times daily.    [provider]  Nutritional Supplements (NUTRITIONAL SUPPLEMENT PO) Take 1 each by mouth See admin instructions. Take one magic cup or might shake as available by mouth daily with lunch and supper due to weight loss    [provider]  oxymetazoline  (AFRIN) 0.05 % nasal spray Place 2 sprays into both nostrils every 12 (twelve) hours as needed (nose bleed). Do not use for more than 3 consecutive days    [provider]  polyethylene glycol (MIRALAX / GLYCOLAX) 17 g packet Take 17 g by mouth every morning. Mix in 4-8 oz water and drink    [provider]  potassium chloride SA (KLOR-CON M) 20 MEQ tablet Take 20 mEq by mouth daily. 06/21/21   [provider]  sertraline (ZOLOFT) 25 MG tablet Take 25 mg by mouth at bedtime.    [provider]  simvastatin  (ZOCOR ) 20 MG tablet Take 20 mg by mouth daily at 6 (six) AM.  [provider]  sodium chloride  (OCEAN) 0.65 % SOLN nasal spray Place 1 spray into both nostrils at bedtime.    [provider]  traMADol  (ULTRAM ) 50 MG tablet Take 1 tablet (50 mg total) by mouth 2 (two) times daily. Patient taking differently: Take 25 mg by mouth 2 (two) times daily. 07/20/20   Hongalgi, Anand D, MD  vitamin B-12 (CYANOCOBALAMIN ) 1000 MCG tablet Take 1 tablet (1,000 mcg total) by mouth daily. Patient taking differently: Take 1,000 mcg by mouth every morning. 07/21/20   Hongalgi, Anand D, MD  vitamin C (ASCORBIC ACID) 500 MG tablet Take 500 mg by mouth 2 (two) times daily.    [provider]    Allergies: Patient has no known allergies.    Review of Systems  HENT:   Positive for nosebleeds.     Updated Vital Signs BP (!) 133/48 (BP Location: Right Arm)   Pulse (!) 53   Temp (!) 97.4 F (36.3 C) (Oral)   Resp 16   Ht 5' (1.524 m)   Wt 55 kg   SpO2 100%   BMI 23.68 kg/m   Physical Exam Vitals and nursing note reviewed.  Constitutional:      General: She is not in acute distress.    Appearance: She is well-developed. She is not diaphoretic.  HENT:     Head: Normocephalic and atraumatic.     Nose:     Comments: Dried blood to the naris bilaterally. No obvious blood in the posterior oropharynx.  Eyes:     Pupils: Pupils are equal, round, and reactive to light.  Cardiovascular:     Rate and Rhythm: Normal rate and regular rhythm.     Heart sounds: No murmur heard.    No friction rub. No gallop.  Pulmonary:     Effort: Pulmonary effort is normal.     Breath sounds: No wheezing or rales.  Abdominal:     General: There is no distension.     Palpations: Abdomen is soft.     Tenderness: There is no abdominal tenderness.  Musculoskeletal:        General: No tenderness.     Cervical back: Normal range of motion and neck supple.  Skin:    General: Skin is warm and dry.  Neurological:     Mental Status: She is alert and oriented to person, place, and time.  Psychiatric:        Behavior: Behavior normal.     (all labs ordered are listed, but only abnormal results are displayed) Labs Reviewed - No data to display  EKG: None  Radiology: No results found.   Procedures   Medications Ordered in the ED - No data to display                                  Medical Decision Making  89 yo F with a cc of epistaxis.  Going on since this morning.  Denies injury.  She feels like the bleeding is stopped and would like to go home.  No obvious hypervascularity at [facility] plexus.  No obvious continued bleeding.  Discussed care at home.  She is eager to go back.  Will put her up for discharge.  Given information to follow-up with ENT if  they so choose.  3:32 PM:  I have discussed the diagnosis/risks/treatment options with the patient.  Evaluation and diagnostic testing in the emergency department  does not suggest an emergent condition requiring admission or immediate intervention beyond what has been performed at this time.  They will follow up with PCP, ENT. We also discussed returning to the ED immediately if new or worsening sx occur. We discussed the sx which are most concerning (e.g., sudden worsening pain, fever, inability to tolerate by mouth, bleeding not controlled with direct pressure) that necessitate immediate return. Medications administered to the patient during their visit and any new prescriptions provided to the patient are listed below.  Medications given during this visit Medications - No data to display   The patient appears reasonably screen and/or stabilized for discharge and I doubt any other medical condition or other [facility] requiring further screening, evaluation, or treatment in the ED at this time prior to discharge.       Final diagnoses:  Epistaxis    ED Discharge Orders     None          Emil Share, DO 11/27/23 1532

## 2023-12-04 ENCOUNTER — Ambulatory Visit (INDEPENDENT_AMBULATORY_CARE_PROVIDER_SITE_OTHER)

## 2023-12-04 ENCOUNTER — Encounter (INDEPENDENT_AMBULATORY_CARE_PROVIDER_SITE_OTHER): Payer: Self-pay

## 2023-12-04 VITALS — BP 119/68 | HR 56 | Temp 98.0°F | Ht <= 58 in | Wt 115.0 lb

## 2023-12-04 DIAGNOSIS — R040 Epistaxis: Secondary | ICD-10-CM | POA: Diagnosis not present

## 2023-12-04 DIAGNOSIS — J3489 Other specified disorders of nose and nasal sinuses: Secondary | ICD-10-CM | POA: Diagnosis not present

## 2023-12-04 MED ORDER — MUPIROCIN 2 % EX OINT: 1.0000 | TOPICAL_OINTMENT | Freq: Two times a day (BID) | CUTANEOUS | 2 refills | Status: AC

## 2023-12-04 MED ORDER — SALINE SPRAY 0.65 % NA SOLN: 1.0000 | NASAL | 12 refills | Status: AC

## 2023-12-05 NOTE — Progress Notes (Signed)
 Dear Dr. Iver, Here is my assessment for our mutual patient, Diane Nolan. Thank you for allowing me the opportunity to care for your patient. Please do not hesitate to contact me should you have any other questions. Sincerely, Dr. Penne Croak  Otolaryngology Clinic Note Referring provider: Dr. Iver HPI:  Discussed the use of AI scribe software for clinical note transcription with the patient, who gave verbal consent to proceed.  History of Present Illness Diane Nolan is a 89 year old female who presents with recurrent nosebleeds and septal perforation. She is accompanied by her son, Diane Nolan.  Epistaxis - Recurrent nosebleeds primarily during winter months since 2022, coinciding with residence at a rehabilitation facility - Episodes occur spontaneously and are sometimes exacerbated by digital nasal manipulation (nose picking) - Bleeding can be difficult to control once it starts - No use of anticoagulant medications  Nasal septal perforation - Significant septal perforation present - No history of nasal surgery, intranasal drug use, or known autoimmune disease to account for the perforation - Reported bilateral digital nasal trauma.   Recent trauma - Sustained a fall one week ago  Current management - Nasal spray administered by facility staff  History also gathered from son.    Independent Review of Additional Tests or Records:  Reviewed external note from referring PCP, Demarchi,describing relevant history incorporated into today's evaluation.   PMH/Meds/All/SocHx/FamHx/ROS:   Past Medical History:  Diagnosis Date   Dementia (HCC)    Diabetes mellitus    HLD (hyperlipidemia)    Hypertension    Intracervical pessary    in place-dr henley manages     Past Surgical History:  Procedure Laterality Date   CHILD BIRTH  X1   CHOLECYSTECTOMY  06/01/2011   Procedure: LAPAROSCOPIC CHOLECYSTECTOMY WITH INTRAOPERATIVE CHOLANGIOGRAM;  Surgeon: Debby LABOR. Cornett, MD;   Location: [facility];  Service: General;  Laterality: N/A;  Laparoscopic cholecystectomy with cholangiogram    Family History  Problem Relation Age of Onset   Other Mother        Old Age   Diabetes Father      Social Connections: Not on file      Current Outpatient Medications:    acetaminophen  (TYLENOL ) 650 MG CR tablet, Take 1 tablet (650 mg total) by mouth every 6 (six) hours as needed for pain., Disp: , Rfl:    Alum & Mag Hydroxide-Simeth (MYLANTA PO), Take 30 mLs by mouth every 2 (two) hours as needed (indigestion)., Disp: , Rfl:    Boswellia-Glucosamine-Vit D (OSTEO BI-FLEX ONE PER DAY PO), Take 2 tablets by mouth every morning., Disp: , Rfl:    Cholecalciferol (VITAMIN D3) 1.25 MG (50000 UT) CAPS, Take 50,000 Units by mouth every Friday., Disp: , Rfl:    docusate sodium (COLACE) 100 MG capsule, Take 100 mg by mouth 2 (two) times daily., Disp: , Rfl:    estradiol  (ESTRACE ) 0.1 MG/GM vaginal cream, Apply 1 gram per vagina every night for 4 weeks, then apply two to three times a week, Disp: 30 g, Rfl: 12   ferrous sulfate (FEROSUL) 325 (65 FE) MG tablet, Take 325 mg by mouth 2 (two) times daily., Disp: , Rfl:    furosemide (LASIX) 40 MG tablet, Take 40 mg by mouth daily., Disp: , Rfl:    levothyroxine (SYNTHROID) 25 MCG tablet, Take 25 mcg by mouth daily at 6 (six) AM., Disp: , Rfl:    LORazepam  (ATIVAN ) 1 MG tablet, Take 1 tablet (1 mg total) by mouth at bedtime., Disp: 5  tablet, Rfl: 0   metoprolol  succinate (TOPROL -XL) 25 MG 24 hr tablet, Take 25 mg by mouth every morning., Disp: , Rfl:    mirtazapine (REMERON) 15 MG tablet, Take 15 mg by mouth at bedtime. (Patient not taking: Reported on 07/04/2021), Disp: , Rfl:    mupirocin ointment (BACTROBAN) 2 %, Apply 1 Application topically 2 (two) times daily. Both sides of nose, Disp: 22 g, Rfl: 2   Nutritional Supplements (NEPRO PO), Take 1 each by mouth 3 (three) times daily., Disp: , Rfl:    Nutritional Supplements  (NUTRITIONAL SUPPLEMENT PO), Take 1 each by mouth See admin instructions. Take one magic cup or might shake as available by mouth daily with lunch and supper due to weight loss, Disp: , Rfl:    oxymetazoline  (AFRIN) 0.05 % nasal spray, Place 2 sprays into both nostrils every 12 (twelve) hours as needed (nose bleed). Do not use for more than 3 consecutive days, Disp: , Rfl:    polyethylene glycol (MIRALAX / GLYCOLAX) 17 g packet, Take 17 g by mouth every morning. Mix in 4-8 oz water and drink, Disp: , Rfl:    potassium chloride SA (KLOR-CON M) 20 MEQ tablet, Take 20 mEq by mouth daily., Disp: , Rfl:    sertraline (ZOLOFT) 25 MG tablet, Take 25 mg by mouth at bedtime., Disp: , Rfl:    simvastatin  (ZOCOR ) 20 MG tablet, Take 20 mg by mouth daily at 6 (six) AM., Disp: , Rfl:    sodium chloride  (OCEAN) 0.65 % SOLN nasal spray, Place 1 spray into both nostrils every 2 (two) hours while awake., Disp: 88 mL, Rfl: 12   traMADol  (ULTRAM ) 50 MG tablet, Take 1 tablet (50 mg total) by mouth 2 (two) times daily. (Patient taking differently: Take 25 mg by mouth 2 (two) times daily.), Disp: 10 tablet, Rfl: 0   vitamin B-12 (CYANOCOBALAMIN ) 1000 MCG tablet, Take 1 tablet (1,000 mcg total) by mouth daily. (Patient taking differently: Take 1,000 mcg by mouth every morning.), Disp: , Rfl:    vitamin C (ASCORBIC ACID) 500 MG tablet, Take 500 mg by mouth 2 (two) times daily., Disp: , Rfl:    Physical Exam:   BP 119/68   Pulse (!) 56   Temp 98 F (36.7 C)   Ht 4' 5 (1.346 m)   Wt 115 lb (52.2 kg)   SpO2 97%   BMI 28.78 kg/m   The patient was awake, alert, and appropriate. The external ears were inspected, and otoscopy was performed to evaluate the external auditory canals and tympanic membranes. The nasal cavity and septum were examined for mucosal changes, obstruction, or discharge. The oral cavity and oropharynx were inspected for mucosal lesions, infection, or tonsillar hypertrophy. The neck was palpated for  lymphadenopathy, thyroid  abnormalities, or other masses. Cranial nerve function was grossly intact.  Pertinent Findings: Physical Exam HEENT: Large septal perforation, no nasal tumors. Poor dentition.    Seprately Identifiable Procedures:  I personally ordered, reviewed and interpreted the following with the patient today  Given the patient's symptoms and incomplete visualization of critical sinonasal areas with anterior rhinoscopy, a separately performed diagnostic nasal endoscopy procedure is indicated for a complete rhinologic evaluation per American Rhinologic Society recommendations (https://[email or web address]-rhinologic.org/position-statements)  I personally ordered, reviewed and interpreted the following with the patient today  Procedure Note Diagnostic Nasal Endoscopy CPT CODE -- 68768 - Mod 25  Prior to initiating any procedures, risks/benefits/alternatives were explained to the patient and verbal consent obtained.  Pre-procedure diagnosis: Concern for mass  and posterior epistaxis Post-procedure diagnosis: same Indication: See pre-procedure diagnosis and physical exam above Complications: None apparent EBL: 0 mL Anesthesia: Lidocaine  4% and topical decongestant was topically sprayed in each nasal cavity  Description of Procedure:  Patient was identified. A flexible fiberoptic endoscope was utilized to evaluate the sinonasal cavities, mucosa, sinus ostia and turbinates and septum.  Overall, signs of mucosal inflammation are noted.  Also noted are large septal perforation with evidence of fresh bleeding along posterior aspect of perforation.  No mucopurulence, polyps, or masses noted.   Right Middle meatus: clear Right SE Recess: clear Left MM: clear Left SE Recess: clear  Impression & Plans:  Shandrea Lusk is a 89 y.o. female  1. Epistaxis   2. Nasal septal perforation    - Findings and diagnoses discussed in detail with the patient. - Risks, benefits, and alternatives were  reviewed. Through shared decision making, the patient elects to proceed with below. Assessment & Plan Nasal septal perforation with recurrent epistaxis Chronic nasal septal perforation with recurrent epistaxis likely due to dryness and manipulation. Differential includes previous nasal surgery, autoimmune disease, or chronic trauma. No evidence of tumors, malignancy, or infection. - Apply nasal saline spray every 2 hours while awake for 14 days, then reduce to 4 times daily. - Apply mupirocin ointment in both nares for 10 days. - Avoid nasal manipulation and picking. - Use a humidifier to maintain moisture. - Educated on avoiding insertion of fingers or tissues into the nose. - Prescribed mupirocin ointment and Ocean nasal spray with instructions.  - Orders placed: No orders of the defined types were placed in this encounter.  - Medications prescribed/continued/adjusted:  Meds ordered this encounter  Medications   mupirocin ointment (BACTROBAN) 2 %    Sig: Apply 1 Application topically 2 (two) times daily. Both sides of nose    Dispense:  22 g    Refill:  2   sodium chloride  (OCEAN) 0.65 % SOLN nasal spray    Sig: Place 1 spray into both nostrils every 2 (two) hours while awake.    Dispense:  88 mL    Refill:  12   - Education materials provided to the patient. - Follow up: PRN. Patient instructed to return sooner or go to the ED if new/worsening symptoms develop.   Thank you for allowing me the opportunity to care for your patient. Please do not hesitate to contact me should you have any other questions.  Sincerely, Penne Croak, DO Otolaryngologist (ENT) [facility] Phone: [phone] Fax: [phone]  12/05/2023, 9:54 PM
